# Patient Record
Sex: Female | Born: 1970 | Race: White | Hispanic: No | Marital: Married | Smoking: Never smoker
Health system: Southern US, Community
[De-identification: ages and names within clinical notes are randomized; demographics above are authoritative.]

## PROBLEM LIST (undated history)

## (undated) DIAGNOSIS — D649 Anemia, unspecified: Secondary | ICD-10-CM

## (undated) HISTORY — DX: Anemia, unspecified: D64.9

## (undated) HISTORY — PX: HERNIA REPAIR: SHX51

---

## 2001-04-19 ENCOUNTER — Encounter: Payer: Self-pay | Admitting: Family Medicine

## 2001-04-19 ENCOUNTER — Ambulatory Visit (HOSPITAL_COMMUNITY): Admission: RE | Admit: 2001-04-19 | Discharge: 2001-04-19 | Payer: Self-pay | Admitting: Family Medicine

## 2001-05-30 ENCOUNTER — Encounter: Payer: Self-pay | Admitting: Gastroenterology

## 2001-05-30 ENCOUNTER — Ambulatory Visit (HOSPITAL_COMMUNITY): Admission: RE | Admit: 2001-05-30 | Discharge: 2001-05-30 | Payer: Self-pay | Admitting: Gastroenterology

## 2001-07-10 ENCOUNTER — Encounter: Payer: Self-pay | Admitting: Gastroenterology

## 2001-07-10 ENCOUNTER — Encounter: Admission: RE | Admit: 2001-07-10 | Discharge: 2001-07-10 | Payer: Self-pay | Admitting: Gastroenterology

## 2002-01-19 ENCOUNTER — Ambulatory Visit (HOSPITAL_COMMUNITY): Admission: RE | Admit: 2002-01-19 | Discharge: 2002-01-19 | Payer: Self-pay | Admitting: Family Medicine

## 2002-01-19 ENCOUNTER — Encounter: Payer: Self-pay | Admitting: Family Medicine

## 2002-05-10 ENCOUNTER — Other Ambulatory Visit: Admission: RE | Admit: 2002-05-10 | Discharge: 2002-05-10 | Payer: Self-pay | Admitting: *Deleted

## 2003-05-01 IMAGING — US US ABDOMEN COMPLETE
1 series · 1 of 1 positions shown · non-contrast
Comparison: none

FINDINGS
INDICATION: LUQ PAIN.
COMPLETE ABDOMINAL ULTRASOUND:
NO PRIOR STUDIES.
THE AORTA DEMONSTRATES A NORMAL TAPERING COURSE.  PANCREATIC HEAD AND BODY APPEAR NORMAL, WITH THE
PANCREATIC TAIL OBSCURED BY OVERLYING BOWEL GAS.
LIVER APPEARS UNREMARKABLE, AS DOES THE GALLBLADDER.  COMMON BILE DUCT MEASURES 4 MM IN DIAMETER,
WHICH IS WITHIN NORMAL LIMITS.  SPLEEN APPEARS NORMAL.  RIGHT KIDNEY MEASURES 10.6 CM IN GREATEST
LENGTH AND THE LEFT KIDNEY MEASURES 11.0 CM IN GREATEST LENGTH.  BOTH KIDNEYS DEMONSTRATE NORMAL
ECHOGENICITY AND ECHOTEXTURE.  NO RENAL CALCULI, MASSES OR HYDRONEPHROSIS ARE DEMONSTRATED.
IMPRESSION
NORMAL COMPLETE ABDOMINAL ULTRASOUND.

[Series 1: us abdomen complete · 1 of 1 slices shown]
[im 1/1]
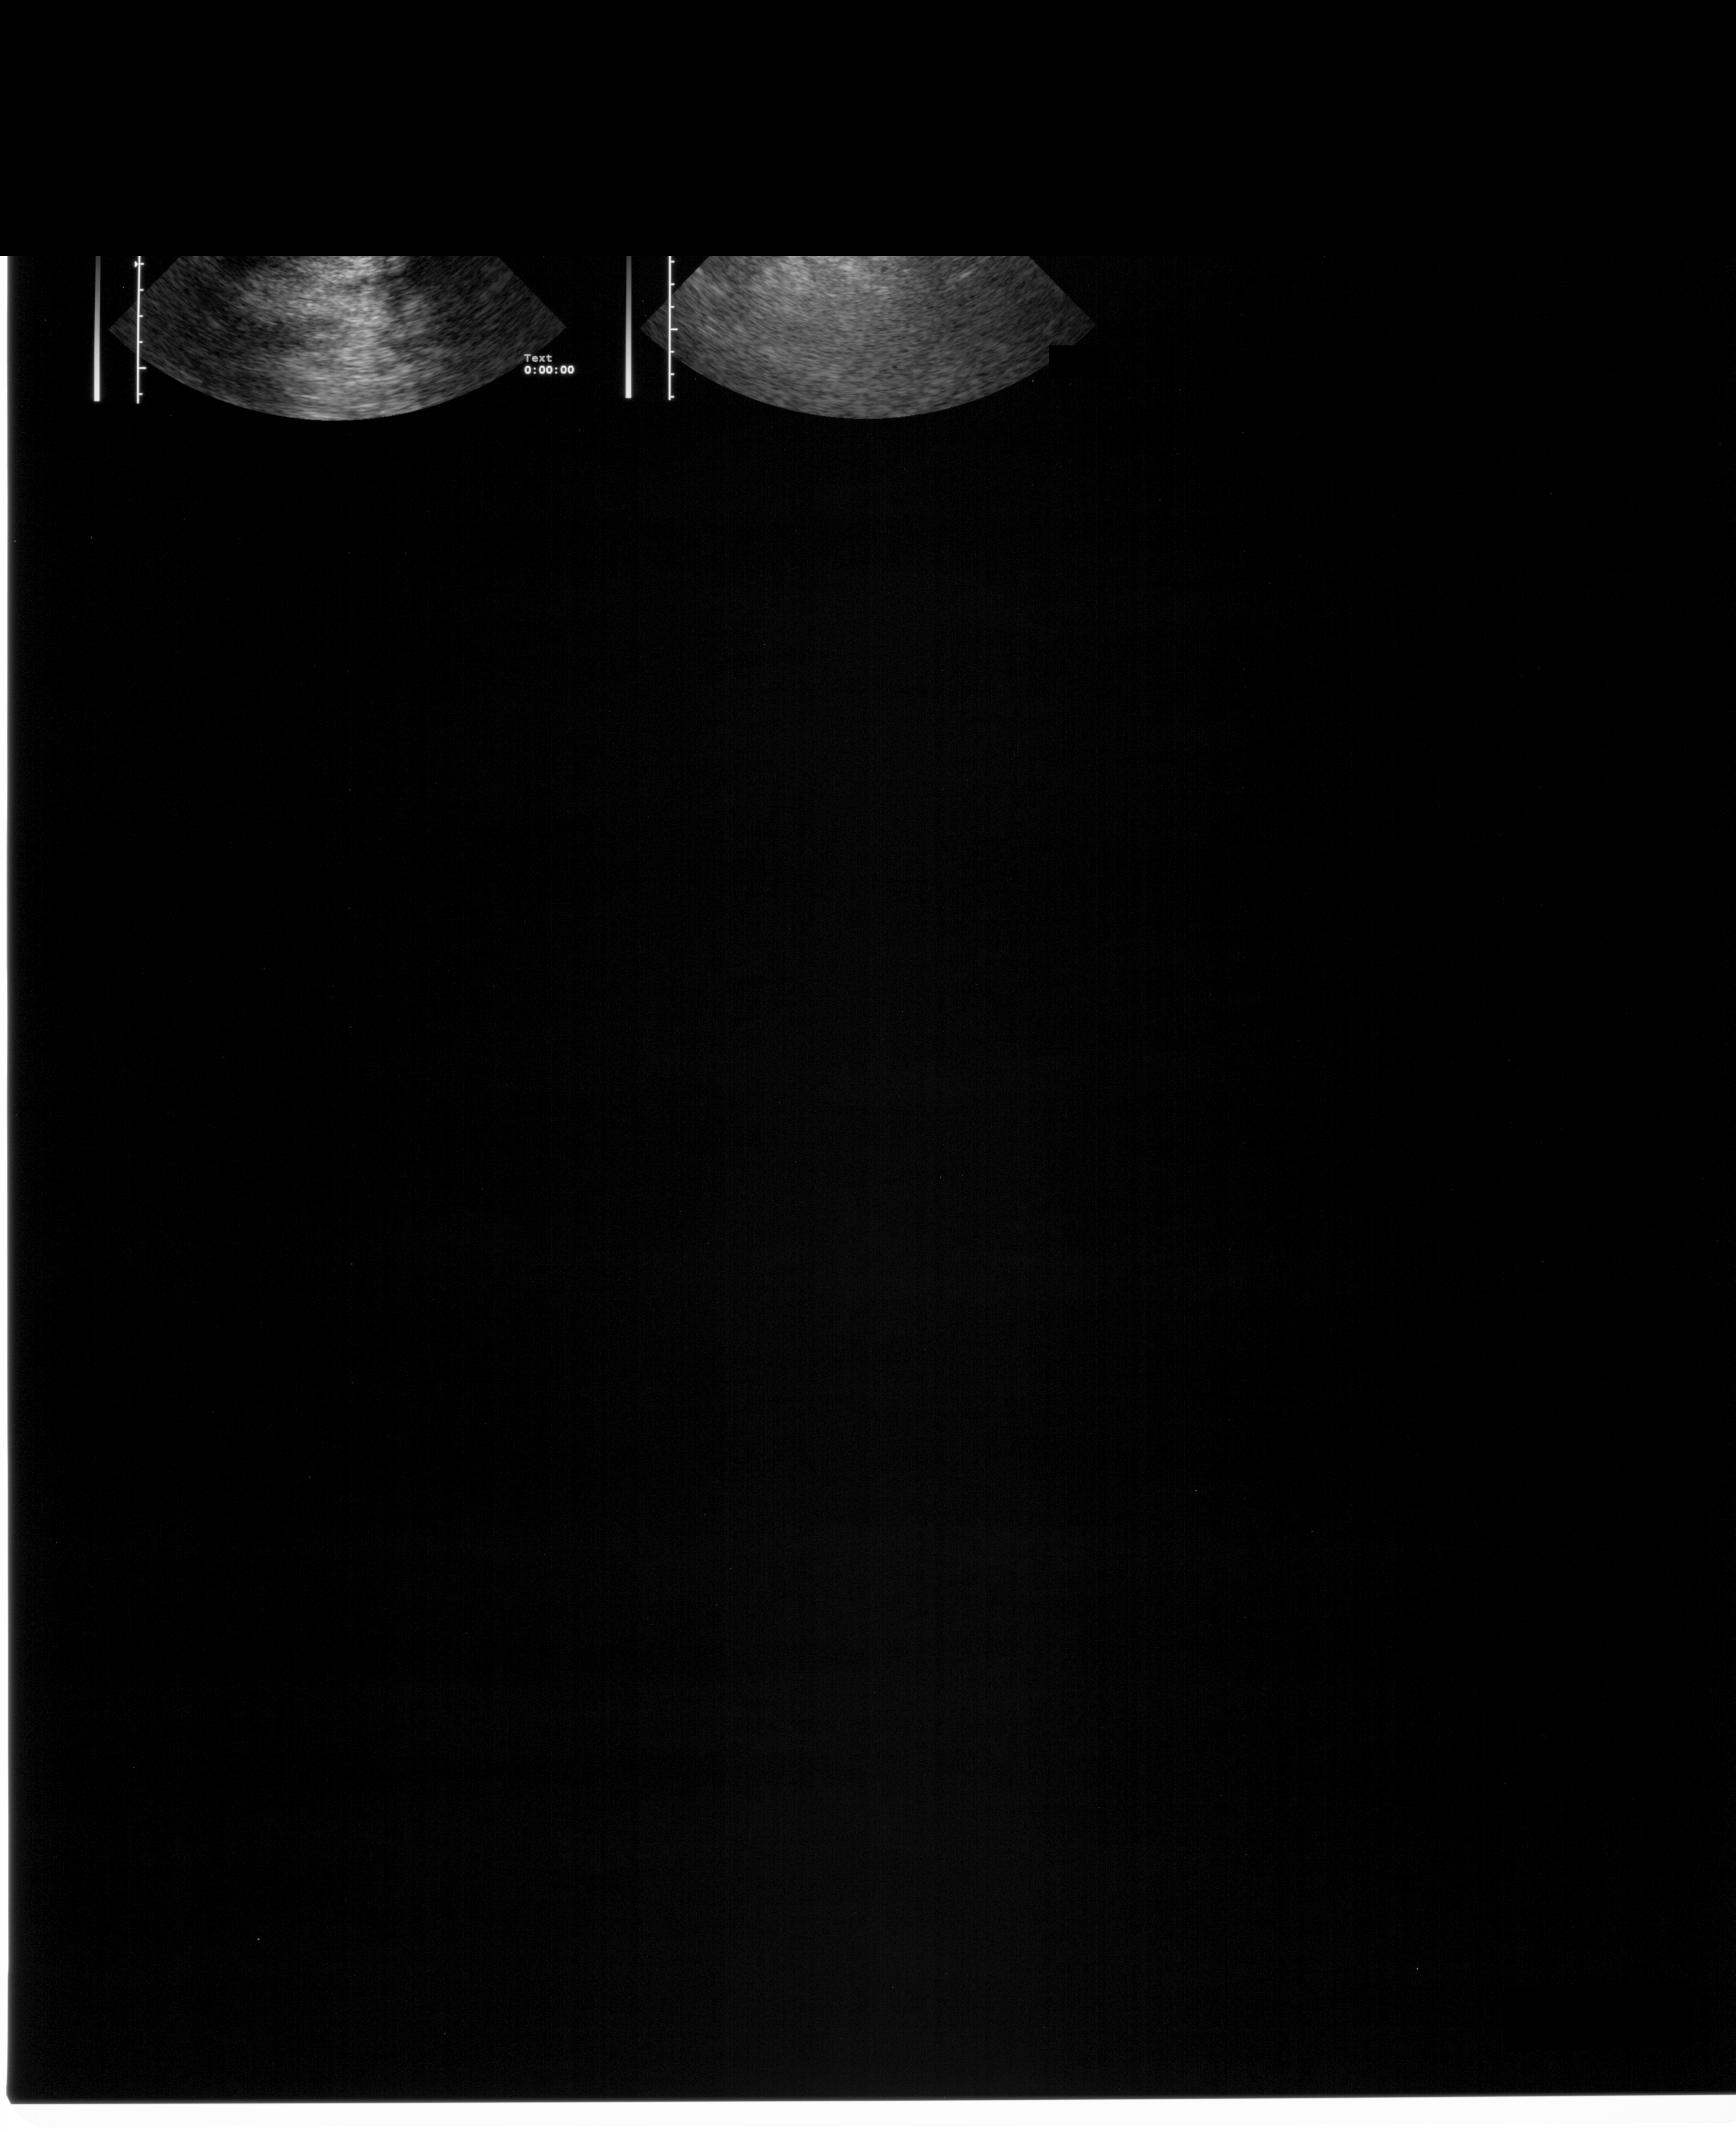

[1 of 1 positions shown; findings below may reference images not displayed]

## 2003-08-19 ENCOUNTER — Other Ambulatory Visit: Admission: RE | Admit: 2003-08-19 | Discharge: 2003-08-19 | Payer: Self-pay | Admitting: *Deleted

## 2005-01-22 ENCOUNTER — Encounter: Admission: RE | Admit: 2005-01-22 | Discharge: 2005-01-22 | Payer: Self-pay | Admitting: Family Medicine

## 2005-08-31 ENCOUNTER — Encounter: Admission: RE | Admit: 2005-08-31 | Discharge: 2005-08-31 | Payer: Self-pay | Admitting: Gastroenterology

## 2007-04-26 ENCOUNTER — Ambulatory Visit (HOSPITAL_COMMUNITY): Admission: RE | Admit: 2007-04-26 | Discharge: 2007-04-26 | Payer: Self-pay | Admitting: Family Medicine

## 2008-07-04 ENCOUNTER — Other Ambulatory Visit: Admission: RE | Admit: 2008-07-04 | Discharge: 2008-07-04 | Payer: Self-pay | Admitting: Family Medicine

## 2010-04-07 ENCOUNTER — Encounter: Admission: RE | Admit: 2010-04-07 | Discharge: 2010-04-07 | Payer: Self-pay | Admitting: Family Medicine

## 2011-04-27 ENCOUNTER — Other Ambulatory Visit (HOSPITAL_COMMUNITY)
Admission: RE | Admit: 2011-04-27 | Discharge: 2011-04-27 | Disposition: A | Discharge status: Home / Self Care | Payer: Self-pay | Source: Ambulatory Visit | Attending: Family Medicine | Admitting: Family Medicine

## 2011-04-27 DIAGNOSIS — Z01419 Encounter for gynecological examination (general) (routine) without abnormal findings: Secondary | ICD-10-CM | POA: Insufficient documentation

## 2011-07-28 ENCOUNTER — Other Ambulatory Visit: Payer: Self-pay | Admitting: Family Medicine

## 2011-07-28 DIAGNOSIS — Z1231 Encounter for screening mammogram for malignant neoplasm of breast: Secondary | ICD-10-CM

## 2011-08-04 ENCOUNTER — Ambulatory Visit: Payer: Self-pay

## 2012-06-05 ENCOUNTER — Ambulatory Visit
Admission: RE | Admit: 2012-06-05 | Discharge: 2012-06-05 | Disposition: A | Discharge status: Home / Self Care | Payer: BC Managed Care – PPO | Source: Ambulatory Visit | Attending: Family Medicine | Admitting: Family Medicine

## 2012-06-05 DIAGNOSIS — Z1231 Encounter for screening mammogram for malignant neoplasm of breast: Secondary | ICD-10-CM

## 2013-07-29 ENCOUNTER — Ambulatory Visit: Payer: BC Managed Care – PPO

## 2013-07-29 ENCOUNTER — Ambulatory Visit: Payer: BC Managed Care – PPO | Admitting: Family Medicine

## 2013-07-29 VITALS — BP 157/86 | HR 75 | Temp 99.0°F | Resp 16 | Ht 65.0 in | Wt 187.8 lb

## 2013-07-29 DIAGNOSIS — M25421 Effusion, right elbow: Secondary | ICD-10-CM

## 2013-07-29 DIAGNOSIS — M25521 Pain in right elbow: Secondary | ICD-10-CM

## 2013-07-29 DIAGNOSIS — IMO0002 Reserved for concepts with insufficient information to code with codable children: Secondary | ICD-10-CM

## 2013-07-29 DIAGNOSIS — L03119 Cellulitis of unspecified part of limb: Secondary | ICD-10-CM

## 2013-07-29 DIAGNOSIS — M25429 Effusion, unspecified elbow: Secondary | ICD-10-CM

## 2013-07-29 DIAGNOSIS — M25529 Pain in unspecified elbow: Secondary | ICD-10-CM

## 2013-07-29 LAB — POCT CBC
HCT, POC: 39.4 % (ref 37.7–47.9)
Lymph, poc: 2.2 (ref 0.6–3.4)
MPV: 11.6 fL (ref 0–99.8)
POC Granulocyte: 4.5 (ref 2–6.9)
POC LYMPH PERCENT: 30.2 %L (ref 10–50)
POC MID %: 6.8 %M (ref 0–12)

## 2013-07-29 MED ORDER — CEPHALEXIN 500 MG PO CAPS: 500.0000 mg | ORAL_CAPSULE | Freq: Three times a day (TID) | ORAL | Status: DC

## 2013-07-29 MED ORDER — DOXYCYCLINE HYCLATE 100 MG PO CAPS: 100.0000 mg | ORAL_CAPSULE | Freq: Two times a day (BID) | ORAL | Status: DC

## 2013-07-29 NOTE — Patient Instructions (Addendum)
1.  RETURN FOR FEVER > 100.5, INCREASING REDNESS/SWELLING/PAIN.  2.  APPLY HEAT TO AREA TWICE DAILY.   3.  REST R ARM.

## 2013-07-29 NOTE — Progress Notes (Signed)
[address]   [place], [place]  [address]   [phone]  Subjective:    Patient ID: Tiffany Wood, female    DOB: 08/08/1971, 42 y.o.   MRN: [id number]  HPI This 42 y.o. female presents for evaluation of R elbow pain.  Onset four days ago.  No fever/chills/sweats; mild malaise/fatigue today only.  Onset with golf ball sized swelling at end of elbow; the following day, the swelling improved. Also had diffuse swelling extending far beyond swelling.  Then redness improved.  Today swelling now extending into R forearm.  Has also had insect bites along elbow that pt has been scratching and manipulating.  Mild axillary LAD R.  No trauma to elbow. Elbow really tender when hits it on surface.  No similar symptoms in the past.   Review of Systems  Constitutional: Positive for fatigue. Negative for fever, chills and diaphoresis.  Musculoskeletal: Positive for myalgias, joint swelling and arthralgias.  Skin: Positive for color change. Negative for rash.  Neurological: Negative for weakness and numbness.    No past medical history on file.  No past surgical history on file.  Prior to Admission medications   Not on File    Allergies  Allergen Reactions  . Codeine Nausea Only    History   Social History  . Marital Status: Married    Spouse Name: N/A    Number of Children: N/A  . Years of Education: N/A   Occupational History  . Not on file.   Social History Main Topics  . Smoking status: Never Smoker   . Smokeless tobacco: Not on file  . Alcohol Use: Not on file  . Drug Use: Not on file  . Sexual Activity: Not on file   Other Topics Concern  . Not on file   Social History Narrative  . No narrative on file    No family history on file.     Objective:   Physical Exam  Nursing note and vitals reviewed. Constitutional: She is oriented to person, place, and time. She appears well-developed and well-nourished. No distress.  Musculoskeletal:       Right elbow: She  exhibits swelling. She exhibits normal range of motion, no effusion, no deformity and no laceration. Tenderness found. Olecranon process tenderness noted. No radial head, no medial epicondyle and no lateral epicondyle tenderness noted.  R ELBOW: SWELLING MEDIAL ASPECT OF ELBOW; +TTP OLECRANON BURSA WITH MILD SWELLING; FULL EXTENSION/FLEXION; NORMAL SUPINATION/PRONATION.  FULL ROM OF R WRIST. GRIP 5/5.  Neurological: She is alert and oriented to person, place, and time.  Skin: She is not diaphoretic.     SCANT/MILD ERYTHEMA POSTERIOR ELBOW 8 CM DIAMETER.  SKIN ABRASION SUPERFICIAL ALONG ELBOW; NO PUSTULES OR VESICLES.  Psychiatric: She has a normal mood and affect. Her behavior is normal.      [facility] reading (PRIMARY) by  Dr. Katrinka Blazing.  R ELBOW: NAD.  Results for orders placed in visit on 07/29/13  POCT CBC      Result Value Range   WBC 7.2  4.6 - 10.2 K/uL   Lymph, poc 2.2  0.6 - 3.4   POC LYMPH PERCENT 30.2  10 - 50 %L   MID (cbc) 0.5  0 - 0.9   POC MID % 6.8  0 - 12 %M   POC Granulocyte 4.5  2 - 6.9   Granulocyte percent 63.0  37 - 80 %G   RBC 4.92  4.04 - 5.48 M/uL   Hemoglobin 12.3  12.2 - 16.2 g/dL   HCT, POC 40.9  81.1 - 47.9 %   MCV 80.1  80 - 97 fL   MCH, POC 25.0 (*) 27 - 31.2 pg   MCHC 31.2 (*) 31.8 - 35.4 g/dL   RDW, POC 91.4     Platelet Count, POC 257  142 - 424 K/uL   MPV 11.6  0 - 99.8 fL     Assessment & Plan:  Pain, elbow, right  Swelling of elbow joint, right - Plan: POCT CBC, POCT SEDIMENTATION RATE, DG Elbow Complete Right  Cellulitis of elbow    1. Pain R elbow:  New. Secondary to cellulitis versus mild olecranon bursitis infected. 2.  Swelling of R elbow:  New. Secondary to cellulitis. 3.  Cellulitis of R elbow:  New. Rx for Keflex and Doxycycline.  RTC for fever, increasing redness/swelling/pain.  Meds ordered this encounter  Medications  . cephALEXin (KEFLEX) 500 MG capsule    Sig: Take 1 capsule (500 mg total) by mouth 3 (three) times daily.     Dispense:  30 capsule    Refill:  0  . doxycycline (VIBRAMYCIN) 100 MG capsule    Sig: Take 1 capsule (100 mg total) by mouth 2 (two) times daily.    Dispense:  20 capsule    Refill:  0

## 2013-08-22 ENCOUNTER — Ambulatory Visit: Payer: BC Managed Care – PPO

## 2013-08-22 ENCOUNTER — Ambulatory Visit: Payer: BC Managed Care – PPO | Admitting: Family Medicine

## 2013-08-22 VITALS — BP 150/90 | HR 72 | Temp 98.8°F | Resp 16 | Ht 66.0 in | Wt 187.0 lb

## 2013-08-22 DIAGNOSIS — S92919A Unspecified fracture of unspecified toe(s), initial encounter for closed fracture: Secondary | ICD-10-CM

## 2013-08-22 DIAGNOSIS — M25579 Pain in unspecified ankle and joints of unspecified foot: Secondary | ICD-10-CM

## 2013-08-22 DIAGNOSIS — S92911A Unspecified fracture of right toe(s), initial encounter for closed fracture: Secondary | ICD-10-CM

## 2013-08-22 DIAGNOSIS — B373 Candidiasis of vulva and vagina: Secondary | ICD-10-CM

## 2013-08-22 DIAGNOSIS — S99921A Unspecified injury of right foot, initial encounter: Secondary | ICD-10-CM

## 2013-08-22 DIAGNOSIS — N898 Other specified noninflammatory disorders of vagina: Secondary | ICD-10-CM

## 2013-08-22 DIAGNOSIS — M25571 Pain in right ankle and joints of right foot: Secondary | ICD-10-CM

## 2013-08-22 DIAGNOSIS — S8990XA Unspecified injury of unspecified lower leg, initial encounter: Secondary | ICD-10-CM

## 2013-08-22 DIAGNOSIS — B372 Candidiasis of skin and nail: Secondary | ICD-10-CM

## 2013-08-22 DIAGNOSIS — L293 Anogenital pruritus, unspecified: Secondary | ICD-10-CM

## 2013-08-22 LAB — POCT WET PREP WITH KOH
KOH Prep POC: POSITIVE
Trichomonas, UA: NEGATIVE

## 2013-08-22 MED ORDER — FLUCONAZOLE 150 MG PO TABS: 150.0000 mg | ORAL_TABLET | Freq: Once | ORAL | Status: DC

## 2013-08-22 MED ORDER — KETOCONAZOLE 2 % EX CREA: TOPICAL_CREAM | Freq: Every day | CUTANEOUS | Status: DC

## 2013-08-22 NOTE — Progress Notes (Signed)
Subjective:    Patient ID: Tiffany Wood, female    DOB: 1971-02-28, 42 y.o.   MRN: [id number]  HPI   Ms. Tiffany Wood is a very pleasant 42 yr old female here with two concerns today.    (1)  Was seen here approx 3 wks ago for cellulitis of her elbow, which has resolved after keflex and doxy.  Subsequently she has noted some vaginal and anal itching.  At one point the skin folds of her groin and the gluteal cleft were both very red and uncomfortable.  She tried Veterinary surgeon medicine with some relief, but symptoms have not fully resolved.  No history of yeast infection, but wonders if this could be the cause due to her recent antibiotic use.    (2)  Two days ago she dropped a granite slab onto the toes of her right foot (2nd, 3rd, 4th toes.)  Initially had pain and the toes bled as the edge of the granite had scraped her skin.  The right 4th toe subsequently became quite swollen and bruised.  It is tender to the touch, esp at the end of the toe.  Bruising now moving up into midfoot.  Swelling has somewhat resolved.  She is able to bear weight but admits to walking back on her heel due to toe pain.  She is using topical essential oils and Advil for pain with good relief.    Of note pt's BP elevated at triage - 150/90.  Pt reports that she gets very anxious at the doctor.  BP was up at last visit as well.  She subsequently checked home BPs daily after last visit with highest reading 126/76.  BP at home this AM was 136 systolic  Review of Systems  Constitutional: Negative.   HENT: Negative.   Respiratory: Negative.   Cardiovascular: Negative.   Gastrointestinal: Negative.   Genitourinary: Positive for vaginal discharge.  Musculoskeletal: Positive for joint swelling and arthralgias.  Skin: Positive for rash.  Neurological: Negative.        Objective:   Physical Exam  Vitals reviewed. Constitutional: She is oriented to person, place, and time. She appears well-developed and well-nourished. No  distress.  HENT:  Head: Normocephalic and atraumatic.  Eyes: No scleral icterus.  Pulmonary/Chest: Effort normal.  Genitourinary: There is rash and tenderness on the right labia. There is rash and tenderness on the left labia. Cervix exhibits no discharge and no friability. Vaginal discharge found.  Skin of labia and perineum erythematous and tender; no drainage, pustules, or vesicles; no satellite lesions   Musculoskeletal:       Right foot: She exhibits decreased range of motion, tenderness, bony tenderness and swelling. She exhibits normal capillary refill.       Feet:  Right 4th toe markedly swollen and bruised; very TTP; mild tenderness at distal metatarsal; tenderness with movement of the 4th toe; small abrasions to 2nd, 3rd, and 4th toes.  2nd and 3rd toes non-tender.  No tenderness in midfoot or ankle.  Pulses normal, cap refill normal  Neurological: She is alert and oriented to person, place, and time.  Skin: Skin is warm and dry.  Psychiatric: She has a normal mood and affect. Her behavior is normal.     [facility] reading (PRIMARY) by  Dr. Neva Seat - nondisplaced, nonarticular fx of 4th distal phalanx   Results for orders placed in visit on 08/22/13  POCT WET PREP WITH KOH      Result Value Range   Trichomonas, UA Negative  Clue Cells Wet Prep HPF POC 0-2     Epithelial Wet Prep HPF POC 1-4     Yeast Wet Prep HPF POC neg     Bacteria Wet Prep HPF POC 2+     RBC Wet Prep HPF POC 0-2     WBC Wet Prep HPF POC 2-6     KOH Prep POC Positive         Assessment & Plan:  Toe fracture, right, closed, initial encounter  -- 42 yr old female here with non-displaced fx of the 4th right toe.  Buddy tape x 4-6wks.  Discussed footwear options - pt has sturdy hiking shoes that she can wear and would prefer this to post-op shoe.  Encouraged pt to wear supportive footwear for at least 4 wks.  Ice and elevated the foot for over the next 48 hours.  Continue Advil for pain relief.  Advance  activity as tolerated.  RTC if needed - follow up will be covered under fracture code.  Foot injury, right, initial encounter - Plan: DG Foot Complete Right  -- See above  Pain in joint, ankle and foot, right - Plan: DG Foot Complete Right  -- See above.  Vaginal candidiasis - Plan: fluconazole (DIFLUCAN) 150 MG tablet  -- Pt with 2 wks of vaginal itching s/p antibiotic use.  Wet prep +KOH with budding yeast.  Will treat with fluconazole x 1.  Repeat in 1 wk if necessary.  Vaginal itching - Plan: POCT Wet Prep with KOH  -- See above.  Candidal intertrigo - Plan: ketoconazole (NIZORAL) 2 % cream  -- In addition to vaginal itching, pt with erythematous itchy skin at groin and gluteal cleft.  On exam, the area is erythematous and tender though not beefy red and with no satellite lesions.  Given recent antibiotic use and +vaginal candida, suspect candidal intertrigo.  Fluconazole x 1 today.  Will also use topical ketoconazole to affected areas.  RTC if worsening or not improving.

## 2013-08-22 NOTE — Patient Instructions (Addendum)
Take the fluconazole pill once today (repeat in one week if needed).  Apply the ketoconazole cream to affected areas once daily until symptoms resolve.    Keep the toes buddy taped for the next 4-6 weeks.  Wear a sturdy, supportive shoe for the next 4 weeks.  Ice and elevate for the next couple days when possible.  Advil and/or Tylenol as needed.  Advance activity as tolerated.  Come back in if worsening or not improving   Toe Fracture Your caregiver has diagnosed you as having a fractured toe. A toe fracture is a break in the bone of a toe. "Buddy taping" is a way of splinting your broken toe, by taping the broken toe to the toe next to it. This "buddy taping" will keep the injured toe from moving beyond normal range of motion. Buddy taping also helps the toe heal in a more normal alignment. It may take 6 to 8 weeks for the toe injury to heal. HOME CARE INSTRUCTIONS   Leave your toes taped together for as long as directed by your caregiver or until you see a doctor for a follow-up examination. You can change the tape after bathing. Always use a small piece of gauze or cotton between the toes when taping them together. This will help the skin stay dry and prevent infection.  Apply ice to the injury for 15-20 minutes each hour while awake for the first 2 days. Put the ice in a plastic bag and place a towel between the bag of ice and your skin.  After the first 2 days, apply heat to the injured area. Use heat for the next 2 to 3 days. Place a heating pad on the foot or soak the foot in warm water as directed by your caregiver.  Keep your foot elevated as much as possible to lessen swelling.  Wear sturdy, supportive shoes. The shoes should not pinch the toes or fit tightly against the toes.  Your caregiver may prescribe a rigid shoe if your foot is very swollen.  Your may be given crutches if the pain is too great and it hurts too much to walk.  Only take over-the-counter or prescription  medicines for pain, discomfort, or fever as directed by your caregiver.  If your caregiver has given you a follow-up appointment, it is very important to keep that appointment. Not keeping the appointment could result in a chronic or permanent injury, pain, and disability. If there is any problem keeping the appointment, you must call back to this facility for assistance. SEEK MEDICAL CARE IF:   You have increased pain or swelling, not relieved with medications.  The pain does not get better after 1 week.  Your injured toe is cold when the others are warm. SEEK IMMEDIATE MEDICAL CARE IF:   The toe becomes cold, numb, or white.  The toe becomes hot (inflamed) and red. Document Released: 11/19/2000 Document Revised: 02/14/2012 Document Reviewed: 07/08/2008 [facility] Patient Information 2014 [place], [place].

## 2013-08-22 NOTE — Progress Notes (Signed)
Xray read and patient discussed with Ms. Debbra Riding. Agree with assessment and plan of care per her note. XR report noted:  FINDINGS:  The patient has a nondisplaced, mildly comminuted fracture of the  distal phalanx of the right 4th toe with associated soft tissue  swelling. No other acute bony or joint abnormality is identified.  Small plantar calcaneal spur is noted.  IMPRESSION:  Nondisplaced fracture distal phalanx right 4th toe.

## 2013-10-11 ENCOUNTER — Other Ambulatory Visit: Payer: Self-pay

## 2014-04-15 ENCOUNTER — Other Ambulatory Visit: Payer: Self-pay | Admitting: Family Medicine

## 2014-04-15 ENCOUNTER — Ambulatory Visit
Admission: RE | Admit: 2014-04-15 | Discharge: 2014-04-15 | Disposition: A | Discharge status: Home / Self Care | Payer: BC Managed Care – PPO | Source: Ambulatory Visit | Attending: Family Medicine | Admitting: Family Medicine

## 2014-04-15 DIAGNOSIS — S90129A Contusion of unspecified lesser toe(s) without damage to nail, initial encounter: Secondary | ICD-10-CM

## 2014-09-20 ENCOUNTER — Other Ambulatory Visit: Payer: Self-pay

## 2014-11-05 HISTORY — PX: OTHER SURGICAL HISTORY: SHX169

## 2015-05-20 ENCOUNTER — Ambulatory Visit (INDEPENDENT_AMBULATORY_CARE_PROVIDER_SITE_OTHER): Payer: BLUE CROSS/BLUE SHIELD | Admitting: Physician Assistant

## 2015-05-20 VITALS — BP 138/82 | HR 93 | Temp 98.6°F | Resp 16 | Ht 65.0 in | Wt 177.0 lb

## 2015-05-20 DIAGNOSIS — R1013 Epigastric pain: Secondary | ICD-10-CM | POA: Diagnosis not present

## 2015-05-20 DIAGNOSIS — R110 Nausea: Secondary | ICD-10-CM

## 2015-05-20 DIAGNOSIS — M545 Low back pain: Secondary | ICD-10-CM

## 2015-05-20 LAB — POCT URINE PREGNANCY: Preg Test, Ur: NEGATIVE

## 2015-05-20 LAB — POCT CBC
Granulocyte percent: 79.9 %G (ref 37–80)
HEMATOCRIT: 41.7 % (ref 37.7–47.9)
HEMOGLOBIN: 12.9 g/dL (ref 12.2–16.2)
LYMPH, POC: 1.3 (ref 0.6–3.4)
MCH: 24.5 pg — AB (ref 27–31.2)
MCHC: 31 g/dL — AB (ref 31.8–35.4)
MCV: 79.3 fL — AB (ref 80–97)
MID (cbc): 0.1 (ref 0–0.9)
MPV: 8.8 fL (ref 0–99.8)
POC Granulocyte: 5.9 (ref 2–6.9)
POC LYMPH %: 18.1 % (ref 10–50)
POC MID %: 2 %M (ref 0–12)
Platelet Count, POC: 256 10*3/uL (ref 142–424)
RBC: 5.25 M/uL (ref 4.04–5.48)
RDW, POC: 16.2 %
WBC: 7.4 10*3/uL (ref 4.6–10.2)

## 2015-05-20 LAB — POCT URINALYSIS DIPSTICK
BILIRUBIN UA: NEGATIVE
GLUCOSE UA: NEGATIVE
Ketones, UA: NEGATIVE
Leukocytes, UA: NEGATIVE
Nitrite, UA: NEGATIVE
Protein, UA: NEGATIVE
RBC UA: NEGATIVE
SPEC GRAV UA: 1.025
UROBILINOGEN UA: 0.2
pH, UA: 5

## 2015-05-20 LAB — POCT UA - MICROSCOPIC ONLY
Bacteria, U Microscopic: NEGATIVE
Casts, Ur, LPF, POC: NEGATIVE
Crystals, Ur, HPF, POC: NEGATIVE
Mucus, UA: NEGATIVE
RBC, URINE, MICROSCOPIC: NEGATIVE
Yeast, UA: NEGATIVE

## 2015-05-20 LAB — POC HEMOCCULT BLD/STL (OFFICE/1-CARD/DIAGNOSTIC): FECAL OCCULT BLD: NEGATIVE

## 2015-05-20 LAB — GLUCOSE, POCT (MANUAL RESULT ENTRY): POC GLUCOSE: 103 mg/dL — AB (ref 70–99)

## 2015-05-20 NOTE — Progress Notes (Signed)
[facility] [address], [place] [address] [phone]  Date:  05/20/2015   Name:  Tiffany Wood   DOB:  Feb 04, 1971   MRN:  [id number]  PCP:  Lolita Patella, MD    History of Present Illness:  Tiffany Wood is a 44 y.o. female patient who present to [facility] with epigastric pain. 1 month of a knot-like pain at epigastric region.  More pronounced at night.  She felt nausea and dizziness within the last few nights.  Diarrhea this morning, and abdominal cramping today.  No blood in the stool or melena.  She does take iron, but it is darkened.  She has no dizziness.  She has some fever and chills.  She has no known weight loss.  Last meal was at 6:30pm yesterday.  She has no heartburn, though she has a hx 10 years ago of reflux.  This has resolved since.   There is frequency , however she believes this is secondary to her increase in hydration.  She denies dysuria or hematuria. There is no fever.  There is no sob, palpitations, or radiating pain.  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 She is into more holistic care and states that she would prefer to manage her health through lifestyle choices rather than medications at this time.  She has a hx of GERD, but states that she treated this with ginger.     Last menstrual period was April 05, 2015. Her last pregnancy was 21 years ago.  She had an ablation last year.  Periods are generally irregular and with cramping.  She believes that her period is arriving shortly--she currently has premenstrual symptoms of low cramping.   Low back pain--noticed at night, when she reclines, it will hurt.  There is no weakness, tingling or numbness at this time.      There are no active problems to display for this patient.   Past Medical History  Diagnosis Date  . Anemia     Past Surgical History  Procedure Laterality Date  . Hernia repair    . Cesarean section    . Ablasion   11/2014     History  Substance Use Topics  . Smoking status: Never Smoker   . Smokeless tobacco: Not on file  . Alcohol Use: Yes    Family History  Problem Relation Age of Onset  . Thyroid disease Mother   . Hypertension Mother   . Hypertension Father   . Arthritis Father     Allergies  Allergen Reactions  . Codeine Nausea Only    Medication list has been reviewed and updated.  Current Outpatient Prescriptions on File Prior to Visit  Medication Sig Dispense Refill  . fluconazole (DIFLUCAN) 150 MG tablet Take 1 tablet (150 mg total) by mouth once. Repeat in 1 wk if needed (Patient not taking: Reported on 05/20/2015) 2 tablet 0  . ketoconazole (NIZORAL) 2 % cream Apply topically daily. (Patient not taking: Reported on 05/20/2015) 60 g 0   No current facility-administered medications on file prior to visit.    ROS ROS otherwise unremarkable unless listed above.  Physical Examination: BP 138/82 mmHg  Pulse 93  Temp(Src) 98.6 F (37 C) (Oral)  Resp 16  Ht 5\' 5"  (1.651 m)  Wt 177 lb (80.287 kg)  BMI 29.45 kg/m2  SpO2 98%  LMP 04/05/2015 Ideal Body Weight: Weight in (lb) to have BMI = 25: 149.9 Wt  Readings from Last 3 Encounters:  05/20/15 177 lb (80.287 kg)  08/22/13 187 lb (84.823 kg)  07/29/13 187 lb 12.8 oz (85.186 kg)    Physical Exam  alert cooperative and oriented 4. Conjunctiva is normal. There is no thyromegaly or mass to neck. No lymph node enlargement. Lung sounds are normal without wheezing or rhonchi. Heart with regular rate and rhythm without murmurs, rubs, or gallops. There is an abdominal mass upon palpation of her epigastric there is mild tenderness here. Negative Murphy's. Just inferior to umbilicus, some tenderness upon palpation however no masses are appreciated. No abdominal bruit. Normal DP and radial pulses. Normal for flexion range of motion there is some tenderness of the lower sacrum when extending back to standing position. Normal lateral deviation  without tenderness normal torso horizontal rotation as well. There is some tenderness along the piriformis is muscle.  No tenderness at the sciatic notch.  There is no spasm appreciated.  Results for orders placed or performed in visit on 05/20/15  COMPLETE METABOLIC PANEL WITH GFR  Result Value Ref Range   Sodium 137 135 - 145 mEq/L   Potassium 5.1 3.5 - 5.3 mEq/L   Chloride 106 96 - 112 mEq/L   CO2 23 19 - 32 mEq/L   Glucose, Bld 98 70 - 99 mg/dL   BUN 13 6 - 23 mg/dL   Creat 1.61 0.96 - 0.45 mg/dL   Total Bilirubin 0.3 0.2 - 1.2 mg/dL   Alkaline Phosphatase 65 39 - 117 U/L   AST 22 0 - 37 U/L   ALT 15 0 - 35 U/L   Total Protein 7.2 6.0 - 8.3 g/dL   Albumin 4.3 3.5 - 5.2 g/dL   Calcium 9.5 8.4 - 40.9 mg/dL   GFR, Est African American >89 mL/min   GFR, Est Non African American >89 mL/min  TSH  Result Value Ref Range   TSH 1.081 0.350 - 4.500 uIU/mL  Lipase  Result Value Ref Range   Lipase 31 0 - 75 U/L  POCT CBC  Result Value Ref Range   WBC 7.4 4.6 - 10.2 K/uL   Lymph, poc 1.3 0.6 - 3.4   POC LYMPH PERCENT 18.1 10 - 50 %L   MID (cbc) 0.1 0 - 0.9   POC MID % 2.0 0 - 12 %M   POC Granulocyte 5.9 2 - 6.9   Granulocyte percent 79.9 37 - 80 %G   RBC 5.25 4.04 - 5.48 M/uL   Hemoglobin 12.9 12.2 - 16.2 g/dL   HCT, POC 81.1 91.4 - 47.9 %   MCV 79.3 (A) 80 - 97 fL   MCH, POC 24.5 (A) 27 - 31.2 pg   MCHC 31.0 (A) 31.8 - 35.4 g/dL   RDW, POC 78.2 %   Platelet Count, POC 256 142 - 424 K/uL   MPV 8.8 0 - 99.8 fL  POCT glucose (manual entry)  Result Value Ref Range   POC Glucose 103 (A) 70 - 99 mg/dl  POCT urinalysis dipstick  Result Value Ref Range   Color, UA yellow    Clarity, UA clear    Glucose, UA neg    Bilirubin, UA neg    Ketones, UA neg    Spec Grav, UA 1.025    Blood, UA neg    pH, UA 5.0    Protein, UA neg    Urobilinogen, UA 0.2    Nitrite, UA neg    Leukocytes, UA Negative Negative  POCT urine pregnancy  Result Value Ref Range   Preg Test, Ur Negative  Negative  POCT UA - Microscopic Only  Result Value Ref Range   WBC, Ur, HPF, POC 0-1    RBC, urine, microscopic neg    Bacteria, U Microscopic neg    Mucus, UA neg    Epithelial cells, urine per micros 0-1    Crystals, Ur, HPF, POC neg    Casts, Ur, LPF, POC neg    Yeast, UA neg   POC Hemoccult Bld/Stl (1-Cd Office Dx)  Result Value Ref Range   Card #1 Date     Fecal Occult Blood, POC Negative Negative    Assessment and Plan: 44 year old female with PMH listed above is here today with epigastric pain.  Diff dx includes peptic ulcer, pancreatitis, gallbladder disease/obstruction, reflux, or gastritis.  I have recommended that we [place].  Advised to start a PPI or H2 seperately, but she declines this, and would like a more holistic approach.  I have warned of the dangers of acid reflux untreated and potential harms.  Patient states that she will restart a ginger regimen.  I will contact with results.  Advised of anemia deficiency, and advised to increase iron food intake.  She was given back stretches to perform for the low back and buttocks, to decrease piriformis tightness.     Abdominal pain, epigastric - Plan: POCT CBC, COMPLETE METABOLIC PANEL WITH GFR, Lipase, US Abdomen Complete, POC Hemoccult Bld/Stl (1-Cd Office Dx)  Low back pain without sciatica, unspecified back pain laterality - Plan: POCT urinalysis dipstick, POCT urine pregnancy, POCT UA - Microscopic Only  Nausea without vomiting - Plan: COMPLETE METABOLIC PANEL WITH GFR, POCT glucose (manual entry), TSH, POCT urinalysis dipstick, POCT urine pregnancy, POCT UA - Microscopic Only, POC Hemoccult Bld/Stl (1-Cd Office Dx)    Trena Platt, PA-C [facility] 05/20/2015 8:37 AM    \

## 2015-05-20 NOTE — Patient Instructions (Addendum)
Piriformis Syndrome with Rehab Piriformis syndrome is a condition the affects the nervous system in the area of the hip, and is characterized by pain and possibly a loss of feeling in the backside (posterior) thigh that may extend down the entire length of the leg. The symptoms are caused by an increase in pressure on the sciatic nerve by the piriformis muscle, which is on the back of the hip and is responsible for externally rotating the hip. The sciatic nerve and its branches connect to much of the leg. Normally the sciatic nerve runs between the piriformis muscle and other muscles. However, in certain individuals the nerve runs through the muscle, which causes an increase in pressure on the nerve and results in the symptoms of piriformis syndrome. SYMPTOMS   Pain, tingling, numbness, or burning in the back of the thigh that may also extend down the entire leg.  Occasionally, tenderness in the buttock.  Loss of function of the leg.  Pain that worsens when using the piriformis muscle (running, jumping, or stairs).  Pain that increases with prolonged sitting.  Pain that is lessened by lying flat on the back. CAUSES   Piriformis syndrome is the result of an increase in pressure placed on the sciatic nerve. Oftentimes, piriformis syndrome is an overuse injury.  Stress placed on the nerve from a sudden increase in the intensity, frequency, or duration of training.  Compensation of other extremity injuries. RISK INCREASES WITH:  Sports that involve the piriformis muscle (running, walking, or jumping).  You are born with (congenital) a defect in which the sciatic nerve passes through the muscle. PREVENTION  Warm up and stretch properly before activity.  Allow for adequate recovery between workouts.  Maintain physical fitness:  Strength, flexibility, and endurance.  Cardiovascular fitness. PROGNOSIS  If treated properly, the symptoms of piriformis syndrome usually resolve in 2 to 6  weeks. RELATED COMPLICATIONS   Persistent and possibly permanent pain and numbness in the lower extremity.  Weakness of the extremity that may progress to disability and inability to compete. TREATMENT  The most effective treatment for piriformis syndrome is rest from any activities that aggravate the symptoms. Ice and pain medication may help reduce pain and inflammation. The use of strengthening and stretching exercises may help reduce pain with activity. These exercises may be performed at home or with a therapist. A referral to a therapist may be given for further evaluation and treatment, such as ultrasound. Corticosteroid injections may be given to reduce inflammation that is causing pressure to be placed on the sciatic nerve. If nonsurgical (conservative) treatment is unsuccessful, then surgery may be recommended.  MEDICATION   If pain medication is necessary, then nonsteroidal anti-inflammatory medications, such as aspirin and ibuprofen, or other minor pain relievers, such as acetaminophen, are often recommended.  Do not take pain medication for 7 days before surgery.  Prescription pain relievers may be given if deemed necessary by your caregiver. Use only as directed and only as much as you need.  Corticosteroid injections may be given by your caregiver. These injections should be reserved for the most serious cases, because they may only be given a certain number of times. HEAT AND COLD:   Cold treatment (icing) relieves pain and reduces inflammation. Cold treatment should be applied for 10 to 15 minutes every 2 to 3 hours for inflammation and pain and immediately after any activity that aggravates your symptoms. Use ice packs or massage the area with a piece of ice (ice massage).  Heat   treatment may be used prior to performing the stretching and strengthening activities prescribed by your caregiver, physical therapist, or athletic trainer. Use a heat pack or soak the injury in warm  water. SEEK IMMEDIATE MEDICAL CARE IF:  Treatment seems to offer no benefit, or the condition worsens.  Any medications produce adverse side effects. EXERCISES RANGE OF MOTION (ROM) AND STRETCHING EXERCISES - Piriformis Syndrome These exercises may help you when beginning to rehabilitate your injury. Your symptoms may resolve with or without further involvement from your physician, physical therapist, or athletic trainer. While completing these exercises, remember:   Restoring tissue flexibility helps normal motion to return to the joints. This allows healthier, less painful movement and activity.  An effective stretch should be held for at least 30 seconds.  A stretch should never be painful. You should only feel a gentle lengthening or release in the stretched tissue. STRETCH - Hip Rotators  Lie on your back on a firm surface. Grasp your right / left knee with your right / left hand and your ankle with your opposite hand.  Keeping your hips and shoulders firmly planted, gently pull your right / left knee and rotate your lower leg toward your opposite shoulder until you feel a stretch in your buttocks.  Hold this stretch for __________ seconds. Repeat this stretch __________ times. Complete this stretch __________ times per day. STRETCH - Iliotibial Band  On the floor or bed, lie on your side so your right / left leg is on top. Bend your knee and grab your ankle.  Slowly bring your knee back so that your thigh is in line with your trunk. Keep your heel at your buttocks and gently arch your back so your head, shoulders, and hips line up.  Slowly lower your leg so that your knee approaches the floor/bed until you feel a gentle stretch on the outside of your right / left thigh. If you do not feel a stretch and your knee will not fall farther, place the heel of your opposite foot on top of your knee and pull your thigh down farther.  Hold this stretch for __________ seconds. Repeat  __________ times. Complete __________ times per day. STRENGTHENING EXERCISES - Piriformis Syndrome  These are some of the caregiver again or until your symptoms are resolved. Remember:   Strong muscles with good endurance tolerate stress better.  Do the exercises as initially prescribed by your caregiver. Progress slowly with each exercise, gradually increasing the number of repetitions and weight used under their guidance. STRENGTH - Hip Abductors, Straight Leg Raises Be aware of your form throughout the entire exercise so that you exercise the correct muscles. Sloppy form means that you are not strengthening the correct muscles.  Lie on your side so that your head, shoulders, knee, and hip line up. You may bend your lower knee to help maintain your balance. Your right / left leg should be on top.  Roll your hips slightly forward, so that your hips are stacked directly over each other and your right / left knee is facing forward.  Lift your top leg up 4-6 inches, leading with your heel. Be sure that your foot does not drift forward or that your knee does not roll toward the ceiling.  Hold this position for __________ seconds. You should feel the muscles in your outer hip lifting (you may not notice this until your leg begins to tire).  Slowly lower your leg to the starting position. Allow the muscles to fully  relax before beginning the next repetition. Repeat __________ times. Complete this exercise __________ times per day.  STRENGTH - Hip Abductors, Quadruped  On a firm, lightly padded surface, position yourself on your hands and knees. Your hands should be directly below your shoulders and your knees should be directly below your hips.  Keeping your right / left knee bent, lift your leg out to the side. Keep your legs level and in line with your shoulders.  Position yourself on your hands and knees.  Hold for __________ seconds.  Keeping your trunk steady and your hips level, slowly  lower your leg to the starting position. Repeat __________ times. Complete this exercise __________ times per day.  STRENGTH - Hip Abductors, Standing  Tie one end of a rubber exercise band/tubing to a secure surface (table, pole) and tie a loop at the other end.  Place the loop around your right / left ankle. Keeping your ankle with the band directly opposite of the secured end, step away until there is tension in the tube/band.  Hold onto a chair as needed for balance.  Keeping your back upright, your shoulders over your hips, and your toes pointing forward, lift your right / left leg out to your side. Be sure to lift your leg with your hip muscles. Do not "throw" your leg or tip your body to lift your leg.  Slowly and with control, return to the starting position. Repeat exercise __________ times. Complete this exercise __________ times per day.  Document Released: 11/22/2005 Document Revised: 04/08/2014 Document Reviewed: 03/06/2009 [facility] Patient Information 2015 [place], [place]. This information is not intended to replace advice given to you by your health care provider. Make sure you discuss any questions you have with your health care provider. Food Choices for Gastroesophageal Reflux Disease When you have gastroesophageal reflux disease (GERD), the foods you eat and your eating habits are very important. Choosing the right foods can help ease the discomfort of GERD. WHAT GENERAL GUIDELINES DO I NEED TO FOLLOW?  Choose fruits, vegetables, whole grains, low-fat dairy products, and low-fat meat, fish, and poultry.  Limit fats such as oils, salad dressings, butter, nuts, and avocado.  Keep a food diary to identify foods that cause symptoms.  Avoid foods that cause reflux. These may be different for different people.  Eat frequent small meals instead of three large meals each day.  Eat your meals slowly, in a relaxed setting.  Limit fried foods.  Cook foods using methods other  than frying.  Avoid drinking alcohol.  Avoid drinking large amounts of liquids with your meals.  Avoid bending over or lying down until 2-3 hours after eating. WHAT FOODS ARE NOT RECOMMENDED? The following are some foods and drinks that may worsen your symptoms: Vegetables Tomatoes. Tomato juice. Tomato and spaghetti sauce. Chili peppers. Onion and garlic. Horseradish. Fruits Oranges, grapefruit, and lemon (fruit and juice). Meats High-fat meats, fish, and poultry. This includes hot dogs, ribs, ham, sausage, salami, and bacon. Dairy Whole milk and chocolate milk. Sour cream. Cream. Butter. Ice cream. Cream cheese.  Beverages Coffee and tea, with or without caffeine. Carbonated beverages or energy drinks. Condiments Hot sauce. Barbecue sauce.  Sweets/Desserts Chocolate and cocoa. Donuts. Peppermint and spearmint. Fats and Oils High-fat foods, including [place] fries and potato chips. Other Vinegar. Strong spices, such as black pepper, white pepper, red pepper, cayenne, curry powder, cloves, ginger, and chili powder. The items listed above may not be a complete list of foods and beverages to avoid.  Contact your dietitian for more information. Document Released: 11/22/2005 Document Revised: 11/27/2013 Document Reviewed: 09/26/2013 [facility] Patient Information 2015 [place], [place]. This information is not intended to replace advice given to you by your health care provider. Make sure you discuss any questions you have with your health care provider.

## 2015-05-21 ENCOUNTER — Telehealth: Payer: Self-pay | Admitting: Physician Assistant

## 2015-05-21 LAB — LIPASE: Lipase: 31 U/L (ref 0–75)

## 2015-05-21 LAB — COMPLETE METABOLIC PANEL WITH GFR
ALBUMIN: 4.3 g/dL (ref 3.5–5.2)
ALT: 15 U/L (ref 0–35)
AST: 22 U/L (ref 0–37)
Alkaline Phosphatase: 65 U/L (ref 39–117)
BUN: 13 mg/dL (ref 6–23)
CALCIUM: 9.5 mg/dL (ref 8.4–10.5)
CHLORIDE: 106 meq/L (ref 96–112)
CO2: 23 mEq/L (ref 19–32)
Creat: 0.63 mg/dL (ref 0.50–1.10)
Glucose, Bld: 98 mg/dL (ref 70–99)
POTASSIUM: 5.1 meq/L (ref 3.5–5.3)
SODIUM: 137 meq/L (ref 135–145)
TOTAL PROTEIN: 7.2 g/dL (ref 6.0–8.3)
Total Bilirubin: 0.3 mg/dL (ref 0.2–1.2)

## 2015-05-21 LAB — TSH: TSH: 1.081 u[IU]/mL (ref 0.350–4.500)

## 2015-05-21 NOTE — Telephone Encounter (Signed)
Patient states that she was in terrible pain last night. The left side of her abdomen was hurting very bad.   [phone]

## 2015-05-21 NOTE — Telephone Encounter (Signed)
Spoke with pt, she states she is in a lot of pain. Can we check on this ultrasound and see if we could get this done today?

## 2015-05-28 ENCOUNTER — Ambulatory Visit
Admission: RE | Admit: 2015-05-28 | Discharge: 2015-05-28 | Disposition: A | Discharge status: Home / Self Care | Payer: Self-pay | Source: Ambulatory Visit | Attending: Physician Assistant | Admitting: Physician Assistant

## 2015-05-28 DIAGNOSIS — R1013 Epigastric pain: Secondary | ICD-10-CM

## 2015-05-30 ENCOUNTER — Telehealth: Payer: Self-pay

## 2015-05-30 NOTE — Telephone Encounter (Signed)
Patient is calling regarding her recent US results. I spoke with Judeth Cornfield about this and she is aware

## 2015-06-01 ENCOUNTER — Telehealth: Payer: Self-pay | Admitting: Physician Assistant

## 2015-06-01 NOTE — Telephone Encounter (Signed)
Left message of ultrasound: Normal but some findings consistent with a fatty liver.  Left message to follow anti-reflux plan, as well as healthy weight loss.  BMI classified as overweight, and decreasing weight to under 155 would with exercise and appropriate diet would work well for her.  If her sxs do not improve within one week, she should contact us.  At this time, she does not want to take a PPI or H2, etc. And would like to stick to holistic tx at this time.

## 2015-06-04 ENCOUNTER — Telehealth: Payer: Self-pay

## 2015-06-04 NOTE — Telephone Encounter (Signed)
Tiffany Wood - Pt wanted to thank you and let you know you have been wonderful with her treatment.  She is going to go to her PCP because of the ongoing problem.  She just figures it will be best.  She wanted you to know how grateful she is.

## 2015-11-17 ENCOUNTER — Other Ambulatory Visit: Payer: Self-pay | Admitting: Obstetrics and Gynecology

## 2015-11-17 DIAGNOSIS — R928 Other abnormal and inconclusive findings on diagnostic imaging of breast: Secondary | ICD-10-CM

## 2015-11-24 ENCOUNTER — Ambulatory Visit
Admission: RE | Admit: 2015-11-24 | Discharge: 2015-11-24 | Disposition: A | Discharge status: Home / Self Care | Payer: 59 | Source: Ambulatory Visit | Attending: Obstetrics and Gynecology | Admitting: Obstetrics and Gynecology

## 2015-11-24 DIAGNOSIS — R928 Other abnormal and inconclusive findings on diagnostic imaging of breast: Secondary | ICD-10-CM

## 2016-01-16 ENCOUNTER — Other Ambulatory Visit: Payer: Self-pay | Admitting: Obstetrics and Gynecology

## 2016-01-16 DIAGNOSIS — N6310 Unspecified lump in the right breast, unspecified quadrant: Principal | ICD-10-CM

## 2016-01-16 DIAGNOSIS — N6315 Unspecified lump in the right breast, overlapping quadrants: Secondary | ICD-10-CM

## 2016-01-21 ENCOUNTER — Ambulatory Visit
Admission: RE | Admit: 2016-01-21 | Discharge: 2016-01-21 | Disposition: A | Discharge status: Home / Self Care | Payer: 59 | Source: Ambulatory Visit | Attending: Obstetrics and Gynecology | Admitting: Obstetrics and Gynecology

## 2016-01-21 DIAGNOSIS — N6315 Unspecified lump in the right breast, overlapping quadrants: Secondary | ICD-10-CM

## 2016-01-21 DIAGNOSIS — N6310 Unspecified lump in the right breast, unspecified quadrant: Principal | ICD-10-CM

## 2016-03-01 ENCOUNTER — Other Ambulatory Visit: Payer: Self-pay | Admitting: Obstetrics and Gynecology

## 2016-03-01 DIAGNOSIS — N6002 Solitary cyst of left breast: Secondary | ICD-10-CM

## 2016-03-04 ENCOUNTER — Ambulatory Visit
Admission: RE | Admit: 2016-03-04 | Discharge: 2016-03-04 | Disposition: A | Discharge status: Home / Self Care | Payer: 59 | Source: Ambulatory Visit | Attending: Obstetrics and Gynecology | Admitting: Obstetrics and Gynecology

## 2016-03-04 DIAGNOSIS — N6002 Solitary cyst of left breast: Secondary | ICD-10-CM

## 2016-04-28 ENCOUNTER — Other Ambulatory Visit: Payer: Self-pay | Admitting: Physical Medicine and Rehabilitation

## 2016-04-28 ENCOUNTER — Ambulatory Visit
Admission: RE | Admit: 2016-04-28 | Discharge: 2016-04-28 | Disposition: A | Discharge status: Home / Self Care | Payer: 59 | Source: Ambulatory Visit | Attending: Physical Medicine and Rehabilitation | Admitting: Physical Medicine and Rehabilitation

## 2016-04-28 DIAGNOSIS — M545 Low back pain: Secondary | ICD-10-CM

## 2016-04-28 DIAGNOSIS — M25552 Pain in left hip: Secondary | ICD-10-CM

## 2018-07-11 DIAGNOSIS — Z1231 Encounter for screening mammogram for malignant neoplasm of breast: Secondary | ICD-10-CM | POA: Diagnosis not present

## 2018-07-11 DIAGNOSIS — Z13820 Encounter for screening for osteoporosis: Secondary | ICD-10-CM | POA: Diagnosis not present

## 2018-07-11 DIAGNOSIS — Z1212 Encounter for screening for malignant neoplasm of rectum: Secondary | ICD-10-CM | POA: Diagnosis not present

## 2018-07-11 DIAGNOSIS — Z6831 Body mass index (BMI) 31.0-31.9, adult: Secondary | ICD-10-CM | POA: Diagnosis not present

## 2018-07-11 DIAGNOSIS — M8588 Other specified disorders of bone density and structure, other site: Secondary | ICD-10-CM | POA: Diagnosis not present

## 2018-07-11 DIAGNOSIS — Z01419 Encounter for gynecological examination (general) (routine) without abnormal findings: Secondary | ICD-10-CM | POA: Diagnosis not present

## 2020-01-22 DIAGNOSIS — Z1231 Encounter for screening mammogram for malignant neoplasm of breast: Secondary | ICD-10-CM | POA: Diagnosis not present

## 2020-01-22 DIAGNOSIS — Z01419 Encounter for gynecological examination (general) (routine) without abnormal findings: Secondary | ICD-10-CM | POA: Diagnosis not present

## 2020-01-22 DIAGNOSIS — Z6831 Body mass index (BMI) 31.0-31.9, adult: Secondary | ICD-10-CM | POA: Diagnosis not present

## 2020-01-25 DIAGNOSIS — M67412 Ganglion, left shoulder: Secondary | ICD-10-CM | POA: Diagnosis not present

## 2020-12-16 DIAGNOSIS — H00011 Hordeolum externum right upper eyelid: Secondary | ICD-10-CM | POA: Diagnosis not present

## 2021-06-26 ENCOUNTER — Other Ambulatory Visit: Payer: Self-pay | Admitting: Family Medicine

## 2021-06-26 ENCOUNTER — Other Ambulatory Visit: Payer: Self-pay

## 2021-06-26 ENCOUNTER — Ambulatory Visit
Admission: RE | Admit: 2021-06-26 | Discharge: 2021-06-26 | Disposition: A | Discharge status: Home / Self Care | Payer: 59 | Source: Ambulatory Visit | Attending: Family Medicine | Admitting: Family Medicine

## 2021-06-26 DIAGNOSIS — R053 Chronic cough: Secondary | ICD-10-CM | POA: Diagnosis not present

## 2021-06-26 DIAGNOSIS — R0602 Shortness of breath: Secondary | ICD-10-CM | POA: Diagnosis not present

## 2021-06-26 DIAGNOSIS — R059 Cough, unspecified: Secondary | ICD-10-CM | POA: Diagnosis not present

## 2021-09-22 ENCOUNTER — Other Ambulatory Visit: Payer: Self-pay | Admitting: Obstetrics and Gynecology

## 2021-09-22 DIAGNOSIS — Z6832 Body mass index (BMI) 32.0-32.9, adult: Secondary | ICD-10-CM | POA: Diagnosis not present

## 2021-09-22 DIAGNOSIS — Z01419 Encounter for gynecological examination (general) (routine) without abnormal findings: Secondary | ICD-10-CM | POA: Diagnosis not present

## 2021-09-22 DIAGNOSIS — Z13820 Encounter for screening for osteoporosis: Secondary | ICD-10-CM | POA: Diagnosis not present

## 2021-09-22 DIAGNOSIS — N644 Mastodynia: Secondary | ICD-10-CM

## 2021-09-22 DIAGNOSIS — R102 Pelvic and perineal pain: Secondary | ICD-10-CM | POA: Diagnosis not present

## 2021-09-22 DIAGNOSIS — R208 Other disturbances of skin sensation: Secondary | ICD-10-CM

## 2021-10-06 ENCOUNTER — Other Ambulatory Visit: Payer: Self-pay | Admitting: Obstetrics and Gynecology

## 2021-10-06 ENCOUNTER — Ambulatory Visit
Admission: RE | Admit: 2021-10-06 | Discharge: 2021-10-06 | Disposition: A | Discharge status: Home / Self Care | Payer: BC Managed Care – PPO | Source: Ambulatory Visit | Attending: Obstetrics and Gynecology | Admitting: Obstetrics and Gynecology

## 2021-10-06 ENCOUNTER — Other Ambulatory Visit: Payer: Self-pay

## 2021-10-06 DIAGNOSIS — R208 Other disturbances of skin sensation: Secondary | ICD-10-CM

## 2021-10-06 DIAGNOSIS — R922 Inconclusive mammogram: Secondary | ICD-10-CM | POA: Diagnosis not present

## 2021-10-06 DIAGNOSIS — N644 Mastodynia: Secondary | ICD-10-CM | POA: Diagnosis not present

## 2021-10-06 DIAGNOSIS — N6320 Unspecified lump in the left breast, unspecified quadrant: Secondary | ICD-10-CM

## 2022-04-07 ENCOUNTER — Other Ambulatory Visit: Payer: BC Managed Care – PPO

## 2022-04-21 ENCOUNTER — Ambulatory Visit
Admission: RE | Admit: 2022-04-21 | Discharge: 2022-04-21 | Disposition: A | Discharge status: Home / Self Care | Payer: BC Managed Care – PPO | Source: Ambulatory Visit | Attending: Obstetrics and Gynecology | Admitting: Obstetrics and Gynecology

## 2022-04-21 ENCOUNTER — Other Ambulatory Visit: Payer: Self-pay | Admitting: Obstetrics and Gynecology

## 2022-04-21 DIAGNOSIS — N6320 Unspecified lump in the left breast, unspecified quadrant: Secondary | ICD-10-CM

## 2022-04-21 DIAGNOSIS — N6489 Other specified disorders of breast: Secondary | ICD-10-CM | POA: Diagnosis not present

## 2022-05-04 DIAGNOSIS — H6692 Otitis media, unspecified, left ear: Secondary | ICD-10-CM | POA: Diagnosis not present

## 2022-06-03 DIAGNOSIS — H9203 Otalgia, bilateral: Secondary | ICD-10-CM | POA: Diagnosis not present

## 2022-06-03 DIAGNOSIS — J3489 Other specified disorders of nose and nasal sinuses: Secondary | ICD-10-CM | POA: Diagnosis not present

## 2022-06-11 DIAGNOSIS — H9203 Otalgia, bilateral: Secondary | ICD-10-CM | POA: Diagnosis not present

## 2022-06-11 DIAGNOSIS — J3489 Other specified disorders of nose and nasal sinuses: Secondary | ICD-10-CM | POA: Diagnosis not present

## 2022-06-11 DIAGNOSIS — J329 Chronic sinusitis, unspecified: Secondary | ICD-10-CM | POA: Diagnosis not present

## 2022-10-27 ENCOUNTER — Ambulatory Visit
Admission: RE | Admit: 2022-10-27 | Discharge: 2022-10-27 | Disposition: A | Discharge status: Home / Self Care | Payer: BC Managed Care – PPO | Source: Ambulatory Visit | Attending: Obstetrics and Gynecology | Admitting: Obstetrics and Gynecology

## 2022-10-27 ENCOUNTER — Other Ambulatory Visit: Payer: Self-pay | Admitting: Obstetrics and Gynecology

## 2022-10-27 DIAGNOSIS — N6320 Unspecified lump in the left breast, unspecified quadrant: Secondary | ICD-10-CM

## 2022-10-27 DIAGNOSIS — N6321 Unspecified lump in the left breast, upper outer quadrant: Secondary | ICD-10-CM | POA: Diagnosis not present

## 2022-10-27 DIAGNOSIS — R921 Mammographic calcification found on diagnostic imaging of breast: Secondary | ICD-10-CM | POA: Diagnosis not present

## 2022-11-01 ENCOUNTER — Other Ambulatory Visit: Payer: Self-pay | Admitting: Obstetrics and Gynecology

## 2022-11-01 DIAGNOSIS — R921 Mammographic calcification found on diagnostic imaging of breast: Secondary | ICD-10-CM

## 2022-11-10 ENCOUNTER — Ambulatory Visit
Admission: RE | Admit: 2022-11-10 | Discharge: 2022-11-10 | Disposition: A | Discharge status: Home / Self Care | Payer: BC Managed Care – PPO | Source: Ambulatory Visit | Attending: Obstetrics and Gynecology | Admitting: Obstetrics and Gynecology

## 2022-11-10 DIAGNOSIS — N6012 Diffuse cystic mastopathy of left breast: Secondary | ICD-10-CM | POA: Diagnosis not present

## 2022-11-10 DIAGNOSIS — R921 Mammographic calcification found on diagnostic imaging of breast: Secondary | ICD-10-CM

## 2022-11-10 HISTORY — PX: BREAST BIOPSY: SHX20

## 2023-01-28 DIAGNOSIS — M546 Pain in thoracic spine: Secondary | ICD-10-CM | POA: Diagnosis not present

## 2023-01-28 DIAGNOSIS — M542 Cervicalgia: Secondary | ICD-10-CM | POA: Diagnosis not present

## 2023-04-06 DIAGNOSIS — Z124 Encounter for screening for malignant neoplasm of cervix: Secondary | ICD-10-CM | POA: Diagnosis not present

## 2023-04-06 DIAGNOSIS — Z01419 Encounter for gynecological examination (general) (routine) without abnormal findings: Secondary | ICD-10-CM | POA: Diagnosis not present

## 2023-04-06 DIAGNOSIS — Z6833 Body mass index (BMI) 33.0-33.9, adult: Secondary | ICD-10-CM | POA: Diagnosis not present

## 2023-05-05 DIAGNOSIS — D259 Leiomyoma of uterus, unspecified: Secondary | ICD-10-CM | POA: Diagnosis not present

## 2023-05-05 DIAGNOSIS — R102 Pelvic and perineal pain: Secondary | ICD-10-CM | POA: Diagnosis not present

## 2023-05-25 DIAGNOSIS — H00012 Hordeolum externum right lower eyelid: Secondary | ICD-10-CM | POA: Diagnosis not present

## 2023-05-28 IMAGING — CR DG CHEST 2V
2 series · 2 of 2 positions shown · non-contrast
Comparison: None.

CLINICAL DATA: Chronic cough. Cough and shortness of breath for 5
months. Productive over the last month.

EXAM:
CHEST - 2 VIEW

[w chest pa]
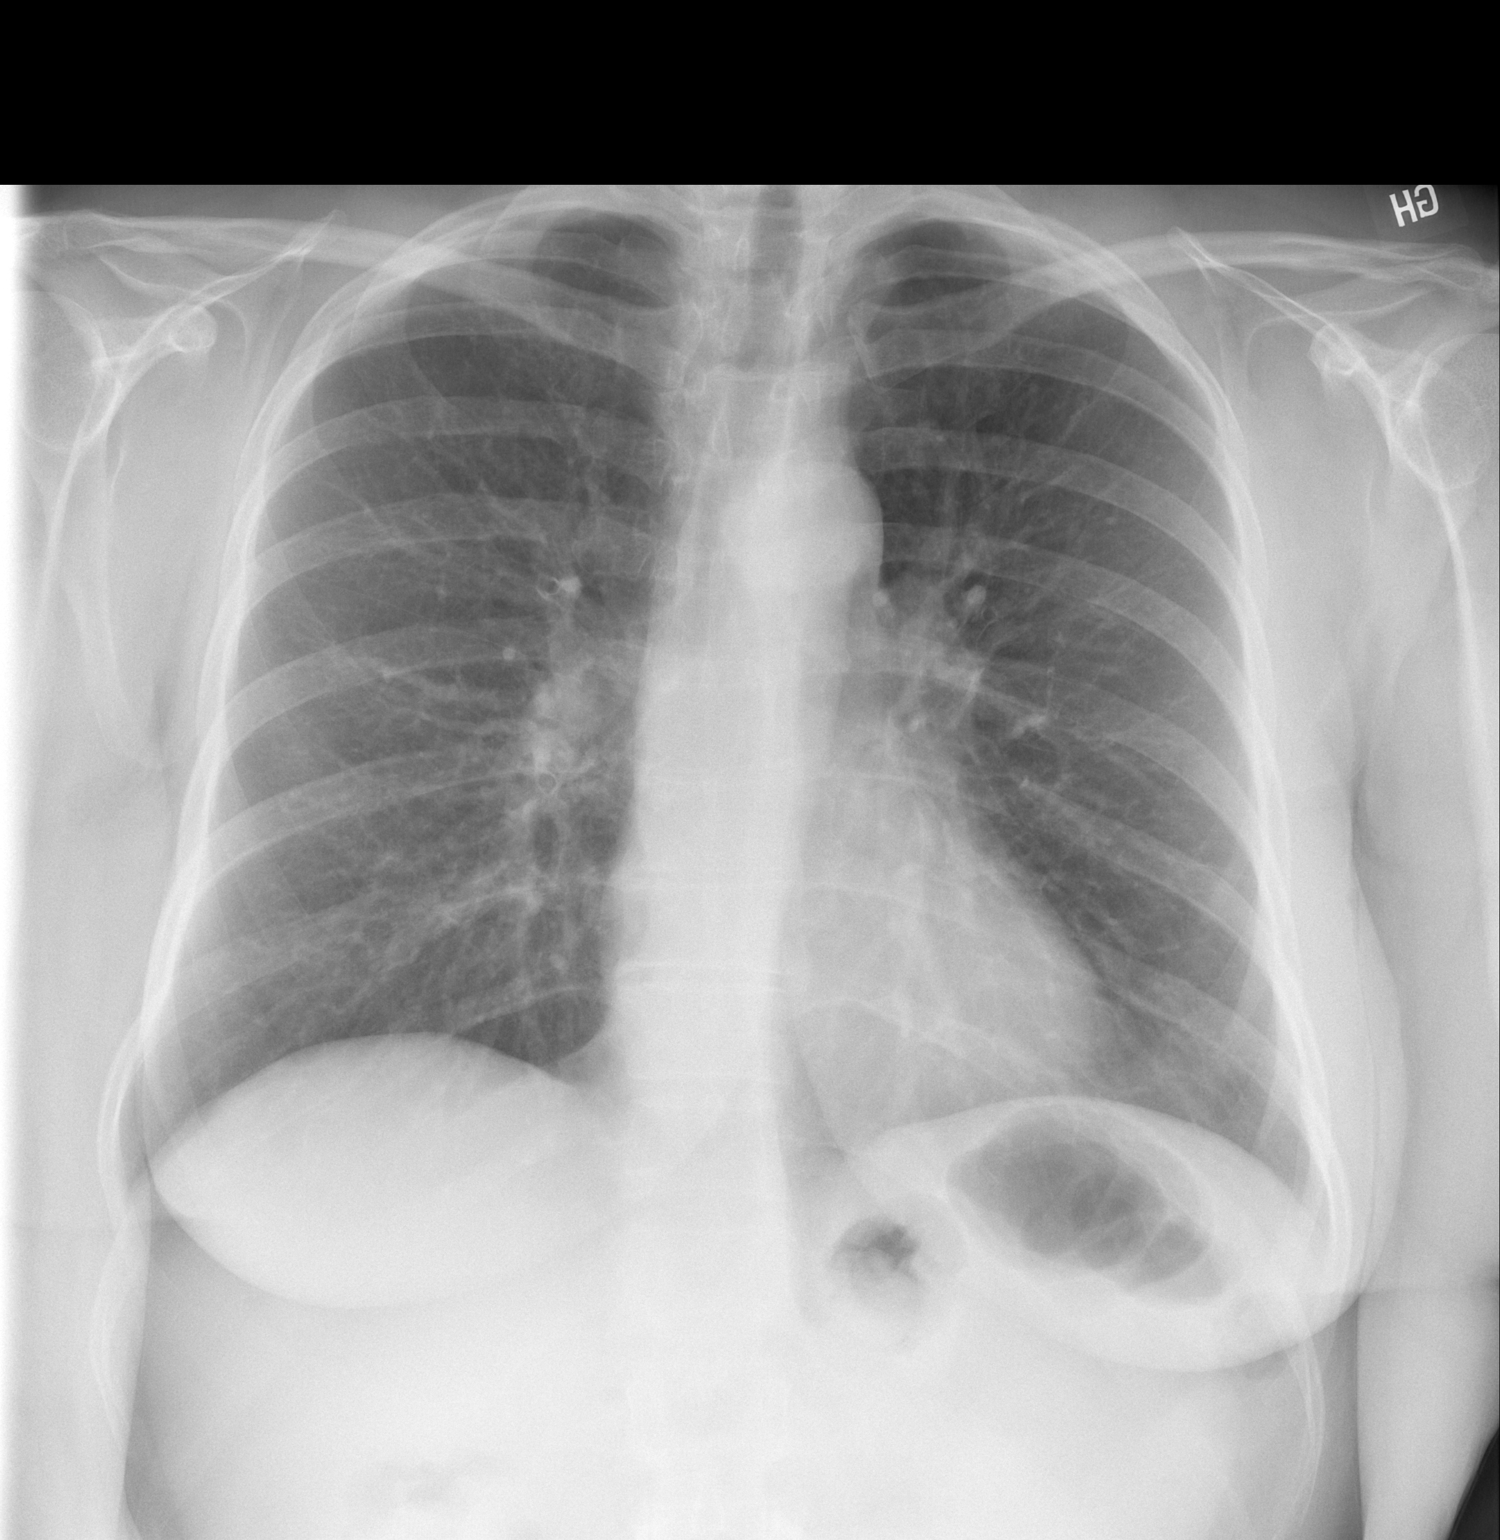

[w chest lat]
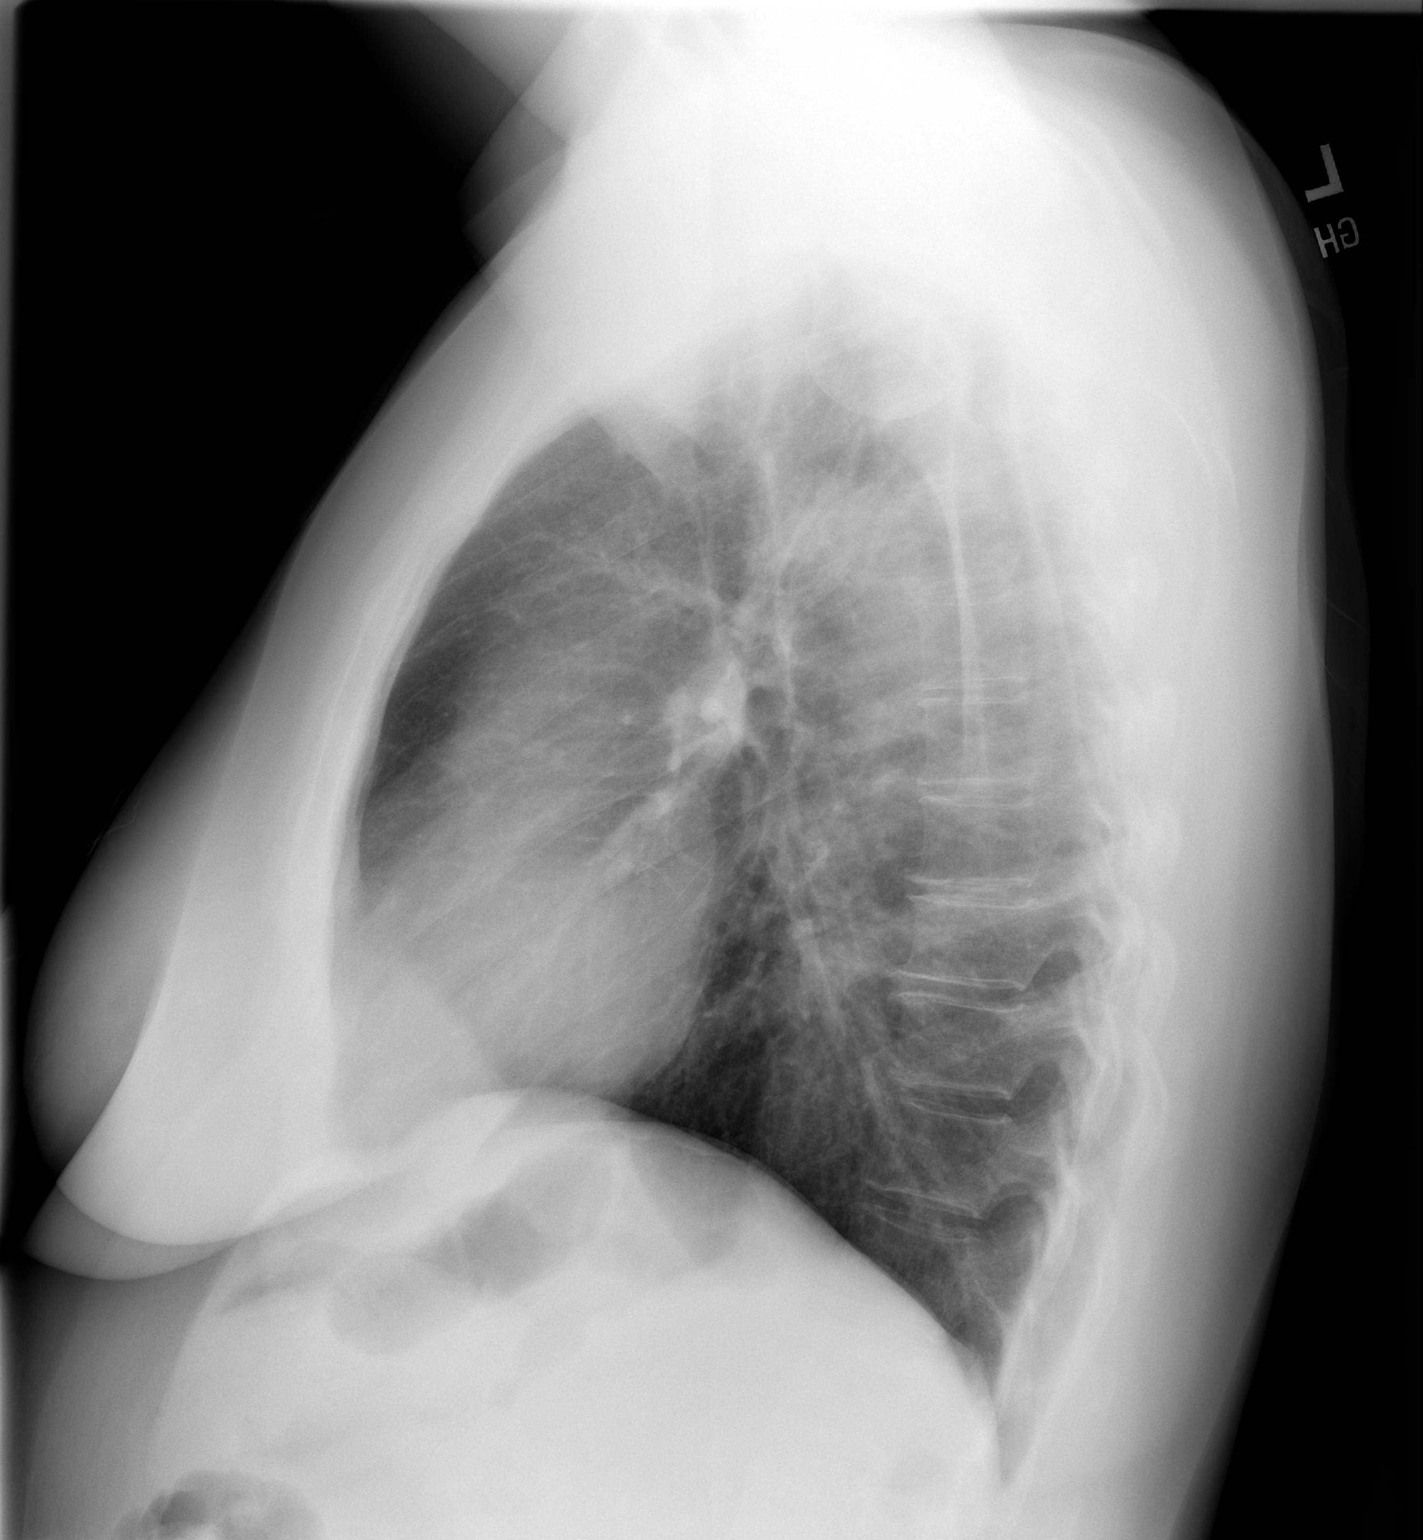

[2 of 2 positions shown; findings below may reference images not displayed]

FINDINGS: The heart size and mediastinal contours are within normal limits.
Both lungs are clear. The visualized skeletal structures are
unremarkable.
IMPRESSION: No active cardiopulmonary disease.

## 2024-01-11 DIAGNOSIS — M25531 Pain in right wrist: Secondary | ICD-10-CM | POA: Diagnosis not present

## 2024-01-11 DIAGNOSIS — M25561 Pain in right knee: Secondary | ICD-10-CM | POA: Diagnosis not present

## 2024-02-01 DIAGNOSIS — M25561 Pain in right knee: Secondary | ICD-10-CM | POA: Diagnosis not present

## 2024-02-01 DIAGNOSIS — M25531 Pain in right wrist: Secondary | ICD-10-CM | POA: Diagnosis not present

## 2024-04-24 DIAGNOSIS — D170 Benign lipomatous neoplasm of skin and subcutaneous tissue of head, face and neck: Secondary | ICD-10-CM | POA: Diagnosis not present

## 2024-04-24 DIAGNOSIS — D171 Benign lipomatous neoplasm of skin and subcutaneous tissue of trunk: Secondary | ICD-10-CM | POA: Diagnosis not present

## 2024-07-31 ENCOUNTER — Ambulatory Visit (INDEPENDENT_AMBULATORY_CARE_PROVIDER_SITE_OTHER): Admitting: Plastic Surgery

## 2024-07-31 ENCOUNTER — Encounter: Payer: Self-pay | Admitting: Plastic Surgery

## 2024-07-31 DIAGNOSIS — R222 Localized swelling, mass and lump, trunk: Secondary | ICD-10-CM | POA: Diagnosis not present

## 2024-07-31 NOTE — H&P (View-Only) (Signed)
     Patient ID: Tiffany Wood, female    DOB: 12-Jun-1971, 53 y.o.   MRN: [id number]   Chief Complaint  Patient presents with   Consult         The patient is a 53 year old female here for evaluation of 2 areas.  She has a 5 x 6 area of mass on the back of her neck upper thoracic area.  The other is in the midline of her back right where her bra strap crosses.  That 1 is about 2 x 4 cm.  They feel like lipomas.  They are soft and slightly movable.  They have been getting larger over the past couple years and now she is starting to get some pain from the area.  Due to the fact that these can become invasive it is a good idea to have them removed.      Review of Systems  Constitutional: Negative.   Eyes: Negative.   Respiratory: Negative.    Cardiovascular: Negative.   Gastrointestinal: Negative.   Endocrine: Negative.   Genitourinary: Negative.   Musculoskeletal: Negative.     Past Medical History:  Diagnosis Date   Anemia     Past Surgical History:  Procedure Laterality Date   ablasion   11/2014   BREAST BIOPSY Left 11/10/2022   MM LT BREAST BX W LOC DEV 1ST LESION IMAGE BX SPEC STEREO GUIDE 11/10/2022 [facility]   CESAREAN SECTION     HERNIA REPAIR        Current Outpatient Medications:    fluconazole  (DIFLUCAN ) 150 MG tablet, Take 1 tablet (150 mg total) by mouth once. Repeat in 1 wk if needed (Patient not taking: Reported on 05/20/2015), Disp: 2 tablet, Rfl: 0   ketoconazole  (NIZORAL ) 2 % cream, Apply topically daily. (Patient not taking: Reported on 05/20/2015), Disp: 60 g, Rfl: 0   Objective:   There were no vitals filed for this visit.  Physical Exam Vitals and nursing note reviewed.  HENT:     Head: Atraumatic.  Cardiovascular:     Rate and Rhythm: Normal rate.     Pulses: Normal pulses.  Musculoskeletal:       Arms:  Skin:    General: Skin is warm.     Capillary Refill: Capillary refill takes less than 2 seconds.     Coloration: Skin is not  jaundiced.     Findings: Lesion present. No bruising.  Neurological:     Mental Status: She is alert and oriented to person, place, and time.  Psychiatric:        Mood and Affect: Mood normal.        Behavior: Behavior normal.        Thought Content: Thought content normal.        Judgment: Judgment normal.     Assessment & Plan:  Mass on back  Plan for excision of back mass cyst x 2  Pictures were obtained of the patient and placed in the chart with the patient's or guardian's permission.   Tiffany RAMAN Javeon Macmurray, DO

## 2024-07-31 NOTE — Progress Notes (Signed)
     Patient ID: Tiffany Wood, female    DOB: 12-Jun-1971, 53 y.o.   MRN: [id number]   Chief Complaint  Patient presents with   Consult         The patient is a 53 year old female here for evaluation of 2 areas.  She has a 5 x 6 area of mass on the back of her neck upper thoracic area.  The other is in the midline of her back right where her bra strap crosses.  That 1 is about 2 x 4 cm.  They feel like lipomas.  They are soft and slightly movable.  They have been getting larger over the past couple years and now she is starting to get some pain from the area.  Due to the fact that these can become invasive it is a good idea to have them removed.      Review of Systems  Constitutional: Negative.   Eyes: Negative.   Respiratory: Negative.    Cardiovascular: Negative.   Gastrointestinal: Negative.   Endocrine: Negative.   Genitourinary: Negative.   Musculoskeletal: Negative.     Past Medical History:  Diagnosis Date   Anemia     Past Surgical History:  Procedure Laterality Date   ablasion   11/2014   BREAST BIOPSY Left 11/10/2022   MM LT BREAST BX W LOC DEV 1ST LESION IMAGE BX SPEC STEREO GUIDE 11/10/2022 [facility]   CESAREAN SECTION     HERNIA REPAIR        Current Outpatient Medications:    fluconazole  (DIFLUCAN ) 150 MG tablet, Take 1 tablet (150 mg total) by mouth once. Repeat in 1 wk if needed (Patient not taking: Reported on 05/20/2015), Disp: 2 tablet, Rfl: 0   ketoconazole  (NIZORAL ) 2 % cream, Apply topically daily. (Patient not taking: Reported on 05/20/2015), Disp: 60 g, Rfl: 0   Objective:   There were no vitals filed for this visit.  Physical Exam Vitals and nursing note reviewed.  HENT:     Head: Atraumatic.  Cardiovascular:     Rate and Rhythm: Normal rate.     Pulses: Normal pulses.  Musculoskeletal:       Arms:  Skin:    General: Skin is warm.     Capillary Refill: Capillary refill takes less than 2 seconds.     Coloration: Skin is not  jaundiced.     Findings: Lesion present. No bruising.  Neurological:     Mental Status: She is alert and oriented to person, place, and time.  Psychiatric:        Mood and Affect: Mood normal.        Behavior: Behavior normal.        Thought Content: Thought content normal.        Judgment: Judgment normal.     Assessment & Plan:  Mass on back  Plan for excision of back mass cyst x 2  Pictures were obtained of the patient and placed in the chart with the patient's or guardian's permission.   Tiffany RAMAN Javeon Macmurray, DO

## 2024-08-20 ENCOUNTER — Ambulatory Visit: Admitting: Student

## 2024-08-20 DIAGNOSIS — R222 Localized swelling, mass and lump, trunk: Secondary | ICD-10-CM

## 2024-08-20 MED ORDER — CEPHALEXIN 500 MG PO CAPS: 500.0000 mg | ORAL_CAPSULE | Freq: Four times a day (QID) | ORAL | 0 refills | Status: AC

## 2024-08-20 MED ORDER — ONDANSETRON HCL 4 MG PO TABS: 4.0000 mg | ORAL_TABLET | Freq: Three times a day (TID) | ORAL | 0 refills | Status: DC | PRN

## 2024-08-20 MED ORDER — TRAMADOL HCL 50 MG PO TABS: 50.0000 mg | ORAL_TABLET | Freq: Three times a day (TID) | ORAL | 0 refills | Status: DC | PRN

## 2024-08-20 NOTE — Progress Notes (Signed)
 Patient ID: Tiffany Wood, female    DOB: Sep 28, 1971, 53 y.o.   MRN: [id number]  Preoperative Appointment     ICD-10-CM   1. Mass on back  R22.2        History of Present Illness: Tiffany Wood is a 53 y.o.  female  with a history of back masses.  She presents for preoperative evaluation for upcoming procedure, excision of back masses x 2, scheduled for 08/30/2024 with Dr. Lowery.  Patient reports that she has had anesthesia in the past and states that she has taken a while to wake up.  She otherwise denies any issues with anesthesia.  Patient denies any history of cardiac disease.  She denies taking any blood thinners.  Patient reports she is not a smoker.  Patient denies taking any birth control or hormone replacement.  She denies any history of miscarriages.  She denies any personal family history of blood clots or clotting diseases.  Patient denies any recent surgeries, or traumas.  She does report that about 5 weeks ago, she had COVID, but this has resolved. She denies any history of stroke or heart attack.  She denies any history of Crohn's disease or ulcerative colitis, COPD or asthma.  She denies any history of cancer.  She denies any varicosities to her lower extremities.  She otherwise denies any recent fevers, chills or changes in her health.  Summary of Previous Visit: Patient was seen for consult by Dr. Lowery on 07/31/2024.  At this visit, patient had a 5 x 6 area of mass on the back of her neck/upper thoracic area.  There is another mass to the midline of her back right where her bra strap crossed.  This was about 2 x 4 cm.  On exam, they felt like lipomas, they were soft and slightly movable.  Patient reported that they had been getting larger over the past couple years and was starting to get some pain from the area.  Plan was to move forward with excision of the masses.  Job: Works part-time from home, planning to take 1 week off  PMH Significant for: Mass of the  buttock   Past Medical History: Allergies: Allergies  Allergen Reactions   Codeine Nausea Only    Current Medications:  Current Outpatient Medications:    cephALEXin  (KEFLEX ) 500 MG capsule, Take 1 capsule (500 mg total) by mouth 4 (four) times daily for 3 days., Disp: 12 capsule, Rfl: 0   ondansetron  (ZOFRAN ) 4 MG tablet, Take 1 tablet (4 mg total) by mouth every 8 (eight) hours as needed for up to 10 doses for nausea or vomiting., Disp: 10 tablet, Rfl: 0   traMADol  (ULTRAM ) 50 MG tablet, Take 1 tablet (50 mg total) by mouth every 8 (eight) hours as needed for up to 10 doses for moderate pain (pain score 4-6) or severe pain (pain score 7-10)., Disp: 10 tablet, Rfl: 0   fluconazole  (DIFLUCAN ) 150 MG tablet, Take 1 tablet (150 mg total) by mouth once. Repeat in 1 wk if needed (Patient not taking: Reported on 05/20/2015), Disp: 2 tablet, Rfl: 0   ketoconazole  (NIZORAL ) 2 % cream, Apply topically daily. (Patient not taking: Reported on 05/20/2015), Disp: 60 g, Rfl: 0  Past Medical Problems: Past Medical History:  Diagnosis Date   Anemia     Past Surgical History: Past Surgical History:  Procedure Laterality Date   ablasion   11/2014   BREAST BIOPSY Left 11/10/2022   MM LT  BREAST BX W LOC DEV 1ST LESION IMAGE BX SPEC STEREO GUIDE 11/10/2022 [facility]   CESAREAN SECTION     HERNIA REPAIR      Social History: Social History   Socioeconomic History   Marital status: Married    Spouse name: Not on file   Number of children: Not on file   Years of education: Not on file   Highest education level: Not on file  Occupational History   Not on file  Tobacco Use   Smoking status: Never   Smokeless tobacco: Not on file  Substance and Sexual Activity   Alcohol use: Yes   Drug use: No   Sexual activity: Yes    Birth control/protection: Surgical  Other Topics Concern   Not on file  Social History Narrative   Not on file   Social Drivers of Health   Financial Resource  Strain: Not on file  Food Insecurity: Not on file  Transportation Needs: Not on file  Physical Activity: Not on file  Stress: Not on file  Social Connections: Not on file  Intimate Partner Violence: Not on file    Family History: Family History  Problem Relation Age of Onset   Thyroid  disease Mother    Hypertension Mother    Hypertension Father    Arthritis Father     Review of Systems: Denies any fevers or chills  Physical Exam: Patient is speaking in full and clear sentences   Assessment/Plan: The patient is scheduled for excision of back masses x 2 with Dr. Lowery.  Risks, benefits, and alternatives of procedure discussed, questions answered and consent obtained.    Smoking Status: Non-smoker; Counseling Given?  N/A  Caprini Score: 3; Risk Factors include: Age and length of planned surgery. Recommendation for mechanical prophylaxis. Encourage early ambulation.   Pictures obtained: @consult   Post-op Rx sent to pharmacy: Keflex , Zofran , tramadol -patient states that she has taken oxycodone in the past and this makes her nauseous.    Instructed patient to hold any multivitamins or supplements at least 1 week prior to surgery.  Patient expressed understanding.  Patient was provided with the General Surgical Risk consent document and Pain Medication Agreement prior to their appointment.  They had adequate time to read through the risk consent documents and Pain Medication Agreement. We also discussed them in person together during this preop appointment. All of their questions were answered to their satisfaction.  Recommended calling if they have any further questions.  Risk consent form and Pain Medication Agreement to be scanned into patient's chart.  The consent was obtained with risks and complications reviewed which included bleeding, pain, scar, infection and the risk of anesthesia.  The patients questions were answered to the patients expressed satisfaction.   The  patient gave consent to have this visit done by telemedicine / virtual visit, two identifiers were used to identify patient. This is also consent for access the chart and treat the patient via this visit. The patient is located in [place] .  I, the provider, am at the office.  We spent 25 minutes together for the visit.  Joined by telephone.    Electronically signed by: Estefana FORBES Peck, PA-C 08/20/2024 11:34 AM

## 2024-08-23 ENCOUNTER — Encounter (HOSPITAL_BASED_OUTPATIENT_CLINIC_OR_DEPARTMENT_OTHER): Payer: Self-pay | Admitting: Plastic Surgery

## 2024-08-23 ENCOUNTER — Other Ambulatory Visit: Payer: Self-pay

## 2024-08-30 ENCOUNTER — Encounter (HOSPITAL_BASED_OUTPATIENT_CLINIC_OR_DEPARTMENT_OTHER)
Admission: RE | Disposition: A | Discharge status: Home / Self Care | Payer: Self-pay | Source: Home / Self Care | Attending: Plastic Surgery

## 2024-08-30 ENCOUNTER — Encounter (HOSPITAL_BASED_OUTPATIENT_CLINIC_OR_DEPARTMENT_OTHER): Payer: Self-pay | Admitting: Plastic Surgery

## 2024-08-30 ENCOUNTER — Other Ambulatory Visit: Payer: Self-pay

## 2024-08-30 ENCOUNTER — Ambulatory Visit (HOSPITAL_BASED_OUTPATIENT_CLINIC_OR_DEPARTMENT_OTHER)
Admission: RE | Admit: 2024-08-30 | Discharge: 2024-08-30 | Disposition: A | Discharge status: Home / Self Care | Attending: Plastic Surgery | Admitting: Plastic Surgery

## 2024-08-30 ENCOUNTER — Ambulatory Visit (HOSPITAL_BASED_OUTPATIENT_CLINIC_OR_DEPARTMENT_OTHER): Payer: Self-pay | Admitting: Anesthesiology

## 2024-08-30 DIAGNOSIS — D171 Benign lipomatous neoplasm of skin and subcutaneous tissue of trunk: Secondary | ICD-10-CM | POA: Diagnosis not present

## 2024-08-30 DIAGNOSIS — D170 Benign lipomatous neoplasm of skin and subcutaneous tissue of head, face and neck: Secondary | ICD-10-CM | POA: Diagnosis not present

## 2024-08-30 DIAGNOSIS — R222 Localized swelling, mass and lump, trunk: Secondary | ICD-10-CM | POA: Diagnosis not present

## 2024-08-30 DIAGNOSIS — Z01818 Encounter for other preprocedural examination: Secondary | ICD-10-CM

## 2024-08-30 LAB — POCT PREGNANCY, URINE: Preg Test, Ur: NEGATIVE

## 2024-08-30 SURGERY — EXCISION MASS, BACK
Anesthesia: General | Site: Back

## 2024-08-30 MED ORDER — ONDANSETRON HCL 4 MG/2ML IJ SOLN
INTRAMUSCULAR | Status: DC | PRN
  Administered 2024-08-30: 4 mg via INTRAVENOUS

## 2024-08-30 MED ORDER — ATROPINE SULFATE 0.4 MG/ML IV SOLN
INTRAVENOUS | Status: AC
  Filled 2024-08-30: qty 1

## 2024-08-30 MED ORDER — OXYCODONE HCL 5 MG PO TABS: 5.0000 mg | ORAL_TABLET | ORAL | Status: DC | PRN

## 2024-08-30 MED ORDER — SODIUM CHLORIDE 0.9 % IV SOLN: 250.0000 mL | INTRAVENOUS | Status: DC | PRN

## 2024-08-30 MED ORDER — CHLORHEXIDINE GLUCONATE CLOTH 2 % EX PADS: 6.0000 | MEDICATED_PAD | Freq: Once | CUTANEOUS | Status: DC

## 2024-08-30 MED ORDER — MIDAZOLAM HCL 2 MG/2ML IJ SOLN
INTRAMUSCULAR | Status: AC
  Filled 2024-08-30: qty 2

## 2024-08-30 MED ORDER — DROPERIDOL 2.5 MG/ML IJ SOLN: 0.6250 mg | Freq: Once | INTRAMUSCULAR | Status: DC | PRN

## 2024-08-30 MED ORDER — SUGAMMADEX SODIUM 200 MG/2ML IV SOLN
INTRAVENOUS | Status: DC | PRN
  Administered 2024-08-30: 200 mg via INTRAVENOUS

## 2024-08-30 MED ORDER — BACITRACIN ZINC 500 UNIT/GM EX OINT
TOPICAL_OINTMENT | CUTANEOUS | Status: AC
  Filled 2024-08-30: qty 28.35

## 2024-08-30 MED ORDER — ACETAMINOPHEN 500 MG PO TABS
1000.0000 mg | ORAL_TABLET | Freq: Once | ORAL | Status: AC
  Administered 2024-08-30: 1000 mg via ORAL

## 2024-08-30 MED ORDER — BUPIVACAINE HCL (PF) 0.25 % IJ SOLN
INTRAMUSCULAR | Status: AC
  Filled 2024-08-30: qty 30

## 2024-08-30 MED ORDER — MIDAZOLAM HCL 5 MG/5ML IJ SOLN
INTRAMUSCULAR | Status: DC | PRN
  Administered 2024-08-30: 2 mg via INTRAVENOUS

## 2024-08-30 MED ORDER — PHENYLEPHRINE 80 MCG/ML (10ML) SYRINGE FOR IV PUSH (FOR BLOOD PRESSURE SUPPORT)
PREFILLED_SYRINGE | INTRAVENOUS | Status: AC
Start: 2024-08-30 — End: 2024-08-30
  Filled 2024-08-30: qty 10

## 2024-08-30 MED ORDER — FENTANYL CITRATE (PF) 100 MCG/2ML IJ SOLN: 25.0000 ug | INTRAMUSCULAR | Status: DC | PRN

## 2024-08-30 MED ORDER — SODIUM CHLORIDE 0.9% FLUSH: 3.0000 mL | Freq: Two times a day (BID) | INTRAVENOUS | Status: DC

## 2024-08-30 MED ORDER — LIDOCAINE 2% (20 MG/ML) 5 ML SYRINGE
INTRAMUSCULAR | Status: AC
  Filled 2024-08-30: qty 5

## 2024-08-30 MED ORDER — LIDOCAINE HCL (CARDIAC) PF 100 MG/5ML IV SOSY
PREFILLED_SYRINGE | INTRAVENOUS | Status: DC | PRN
  Administered 2024-08-30: 100 mg via INTRAVENOUS

## 2024-08-30 MED ORDER — FENTANYL CITRATE (PF) 100 MCG/2ML IJ SOLN
INTRAMUSCULAR | Status: AC
  Filled 2024-08-30: qty 2

## 2024-08-30 MED ORDER — BUPIVACAINE-EPINEPHRINE (PF) 0.25% -1:200000 IJ SOLN
INTRAMUSCULAR | Status: AC
Start: 2024-08-30 — End: 2024-08-30
  Filled 2024-08-30: qty 30

## 2024-08-30 MED ORDER — ROCURONIUM BROMIDE 10 MG/ML (PF) SYRINGE
PREFILLED_SYRINGE | INTRAVENOUS | Status: AC
Start: 2024-08-30 — End: 2024-08-30
  Filled 2024-08-30: qty 10

## 2024-08-30 MED ORDER — ACETAMINOPHEN 500 MG PO TABS
ORAL_TABLET | ORAL | Status: AC
  Filled 2024-08-30: qty 2

## 2024-08-30 MED ORDER — OXYCODONE HCL 5 MG PO TABS: 5.0000 mg | ORAL_TABLET | Freq: Once | ORAL | Status: DC | PRN

## 2024-08-30 MED ORDER — FENTANYL CITRATE (PF) 100 MCG/2ML IJ SOLN
INTRAMUSCULAR | Status: DC | PRN
  Administered 2024-08-30: 50 ug via INTRAVENOUS

## 2024-08-30 MED ORDER — PROPOFOL 10 MG/ML IV BOLUS
INTRAVENOUS | Status: DC | PRN
  Administered 2024-08-30: 180 mg via INTRAVENOUS

## 2024-08-30 MED ORDER — SUCCINYLCHOLINE CHLORIDE 200 MG/10ML IV SOSY
PREFILLED_SYRINGE | INTRAVENOUS | Status: AC
  Filled 2024-08-30: qty 10

## 2024-08-30 MED ORDER — PHENYLEPHRINE HCL (PRESSORS) 10 MG/ML IV SOLN
INTRAVENOUS | Status: DC | PRN
Start: 2024-08-30 — End: 2024-08-30
  Administered 2024-08-30: 80 ug via INTRAVENOUS

## 2024-08-30 MED ORDER — OXYCODONE HCL 5 MG/5ML PO SOLN: 5.0000 mg | Freq: Once | ORAL | Status: DC | PRN

## 2024-08-30 MED ORDER — ROCURONIUM BROMIDE 100 MG/10ML IV SOLN
INTRAVENOUS | Status: DC | PRN
  Administered 2024-08-30: 50 mg via INTRAVENOUS

## 2024-08-30 MED ORDER — SODIUM CHLORIDE 0.9% FLUSH: 3.0000 mL | INTRAVENOUS | Status: DC | PRN

## 2024-08-30 MED ORDER — 0.9 % SODIUM CHLORIDE (POUR BTL) OPTIME
TOPICAL | Status: DC | PRN
  Administered 2024-08-30: 1000 mL

## 2024-08-30 MED ORDER — LIDOCAINE-EPINEPHRINE 1 %-1:100000 IJ SOLN
INTRAMUSCULAR | Status: DC | PRN
  Administered 2024-08-30: 5 mL

## 2024-08-30 MED ORDER — ONDANSETRON HCL 4 MG/2ML IJ SOLN
INTRAMUSCULAR | Status: AC
Start: 2024-08-30 — End: 2024-08-30
  Filled 2024-08-30: qty 2

## 2024-08-30 MED ORDER — CEFAZOLIN SODIUM-DEXTROSE 2-4 GM/100ML-% IV SOLN
INTRAVENOUS | Status: AC
  Filled 2024-08-30: qty 100

## 2024-08-30 MED ORDER — DEXMEDETOMIDINE HCL IN NACL 80 MCG/20ML IV SOLN
INTRAVENOUS | Status: DC | PRN
  Administered 2024-08-30: 8 ug via INTRAVENOUS

## 2024-08-30 MED ORDER — EPHEDRINE 5 MG/ML INJ
INTRAVENOUS | Status: AC
Start: 2024-08-30 — End: 2024-08-30
  Filled 2024-08-30: qty 5

## 2024-08-30 MED ORDER — CEFAZOLIN SODIUM-DEXTROSE 2-4 GM/100ML-% IV SOLN
2.0000 g | INTRAVENOUS | Status: AC
  Administered 2024-08-30: 2 g via INTRAVENOUS

## 2024-08-30 MED ORDER — ACETAMINOPHEN 325 MG PO TABS: 650.0000 mg | ORAL_TABLET | ORAL | Status: DC | PRN

## 2024-08-30 MED ORDER — DEXAMETHASONE SODIUM PHOSPHATE 10 MG/ML IJ SOLN
INTRAMUSCULAR | Status: AC
  Filled 2024-08-30: qty 1

## 2024-08-30 MED ORDER — LACTATED RINGERS IV SOLN: INTRAVENOUS | Status: DC

## 2024-08-30 MED ORDER — EPHEDRINE SULFATE (PRESSORS) 50 MG/ML IJ SOLN
INTRAMUSCULAR | Status: DC | PRN
  Administered 2024-08-30: 10 mg via INTRAVENOUS

## 2024-08-30 MED ORDER — ACETAMINOPHEN 325 MG RE SUPP: 650.0000 mg | RECTAL | Status: DC | PRN

## 2024-08-30 MED ORDER — DEXAMETHASONE SODIUM PHOSPHATE 4 MG/ML IJ SOLN
INTRAMUSCULAR | Status: DC | PRN
  Administered 2024-08-30: 5 mg via INTRAVENOUS

## 2024-08-30 SURGICAL SUPPLY — 58 items
BAND RUBBER #18 3X1/16 STRL (MISCELLANEOUS) IMPLANT
BENZOIN TINCTURE PRP APPL 2/3 (GAUZE/BANDAGES/DRESSINGS) IMPLANT
BLADE CLIPPER SURG (BLADE) IMPLANT
BLADE SURG 15 STRL LF DISP TIS (BLADE) ×2 IMPLANT
BNDG ELASTIC 2INX 5YD STR LF (GAUZE/BANDAGES/DRESSINGS) IMPLANT
CANISTER SUCT 1200ML W/VALVE (MISCELLANEOUS) IMPLANT
CLEANER CAUTERY TIP PAD (MISCELLANEOUS) IMPLANT
CLSR STERI-STRIP ANTIMIC 1/2X4 (GAUZE/BANDAGES/DRESSINGS) IMPLANT
CORD BIPOLAR FORCEPS 12FT (ELECTRODE) IMPLANT
COVER BACK TABLE 60X90IN (DRAPES) ×2 IMPLANT
COVER MAYO STAND STRL (DRAPES) ×2 IMPLANT
DERMABOND ADVANCED .7 DNX12 (GAUZE/BANDAGES/DRESSINGS) IMPLANT
DRAPE LAPAROTOMY 100X72 PEDS (DRAPES) IMPLANT
DRAPE U-SHAPE 76X120 STRL (DRAPES) ×2 IMPLANT
DRESSING MEPILEX FLEX 4X4 (GAUZE/BANDAGES/DRESSINGS) IMPLANT
DRSG TEGADERM 2-3/8X2-3/4 SM (GAUZE/BANDAGES/DRESSINGS) IMPLANT
DRSG TEGADERM 4X4.75 (GAUZE/BANDAGES/DRESSINGS) IMPLANT
ELECT COATED BLADE 2.86 ST (ELECTRODE) IMPLANT
ELECT NDL BLADE 2-5/6 (NEEDLE) IMPLANT
ELECT NEEDLE BLADE 2-5/6 (NEEDLE) IMPLANT
ELECTRODE REM PT RETRN 9FT PED (ELECTROSURGICAL) IMPLANT
ELECTRODE REM PT RTRN 9FT ADLT (ELECTROSURGICAL) IMPLANT
GAUZE SPONGE 2X2 STRL 8-PLY (GAUZE/BANDAGES/DRESSINGS) IMPLANT
GAUZE SPONGE 4X4 12PLY STRL LF (GAUZE/BANDAGES/DRESSINGS) IMPLANT
GAUZE STRETCH 2X75IN STRL (MISCELLANEOUS) IMPLANT
GLOVE BIO SURGEON STRL SZ 6.5 (GLOVE) ×4 IMPLANT
GLOVE BIOGEL M STRL SZ7.5 (GLOVE) ×2 IMPLANT
GOWN STRL REUS W/ TWL LRG LVL3 (GOWN DISPOSABLE) ×4 IMPLANT
GOWN STRL REUS W/ TWL XL LVL3 (GOWN DISPOSABLE) ×2 IMPLANT
NDL HYPO 30GX1 BEV (NEEDLE) IMPLANT
NDL PRECISIONGLIDE 27X1.5 (NEEDLE) ×2 IMPLANT
NEEDLE HYPO 30GX1 BEV (NEEDLE) IMPLANT
NEEDLE PRECISIONGLIDE 27X1.5 (NEEDLE) ×1 IMPLANT
NS IRRIG 1000ML POUR BTL (IV SOLUTION) IMPLANT
PACK BASIN DAY SURGERY FS (CUSTOM PROCEDURE TRAY) ×2 IMPLANT
PENCIL SMOKE EVACUATOR (MISCELLANEOUS) IMPLANT
SHEET MEDIUM DRAPE 40X70 STRL (DRAPES) IMPLANT
SLEEVE SCD COMPRESS KNEE MED (STOCKING) IMPLANT
SPIKE FLUID TRANSFER (MISCELLANEOUS) IMPLANT
STRIP CLOSURE SKIN 1/2X4 (GAUZE/BANDAGES/DRESSINGS) IMPLANT
SUCTION TUBE FRAZIER 10FR DISP (SUCTIONS) IMPLANT
SUT MNCRL 6-0 UNDY P1 1X18 (SUTURE) IMPLANT
SUT MNCRL AB 3-0 PS2 18 (SUTURE) IMPLANT
SUT MNCRL AB 4-0 PS2 18 (SUTURE) IMPLANT
SUT MON AB 5-0 P3 18 (SUTURE) IMPLANT
SUT MON AB 5-0 PS2 18 (SUTURE) IMPLANT
SUT PDS II 3-0 CT2 27 ABS (SUTURE) IMPLANT
SUT PROLENE 5 0 P 3 (SUTURE) IMPLANT
SUT PROLENE 5 0 PS 2 (SUTURE) IMPLANT
SUT PROLENE 6 0 P 1 18 (SUTURE) IMPLANT
SUT VIC AB 4-0 PS2 18 (SUTURE) IMPLANT
SUT VIC AB 5-0 P-3 18X BRD (SUTURE) IMPLANT
SUT VIC AB 5-0 PS2 18 (SUTURE) IMPLANT
SYR BULB EAR ULCER 3OZ GRN STR (SYRINGE) IMPLANT
SYR CONTROL 10ML LL (SYRINGE) ×2 IMPLANT
TOWEL GREEN STERILE FF (TOWEL DISPOSABLE) ×2 IMPLANT
TRAY DSU PREP LF (CUSTOM PROCEDURE TRAY) ×2 IMPLANT
TUBE CONNECTING 20X1/4 (TUBING) IMPLANT

## 2024-08-30 NOTE — Transfer of Care (Signed)
 Immediate Anesthesia Transfer of Care Note  Patient: Tiffany Wood  Procedure(s) Performed: EXCISION MASS, BACK (Back)  Patient Location: PACU  Anesthesia Type:General  Level of Consciousness: awake, alert , oriented, drowsy, and patient cooperative  Airway & Oxygen Therapy: Patient Spontanous Breathing and Patient connected to face mask oxygen  Post-op Assessment: Report given to RN and Post -op Vital signs reviewed and stable  Post vital signs: Reviewed and stable  Last Vitals:  Vitals Value Taken Time  BP    Temp    Pulse    Resp    SpO2      Last Pain:  Vitals:   08/30/24 0910  TempSrc: Temporal  PainSc: 0-No pain      Patients Stated Pain Goal: 3 (08/30/24 0910)  Complications: No notable events documented.

## 2024-08-30 NOTE — Discharge Instructions (Addendum)
 Post Anesthesia Home Care Instructions  Activity: Get plenty of rest for the remainder of the day. A responsible individual must stay with you for 24 hours following the procedure.  For the next 24 hours, DO NOT: -Drive a car -Advertising copywriter -Drink alcoholic beverages -Take any medication unless instructed by your physician -Make any legal decisions or sign important papers.  Meals: Start with liquid foods such as gelatin or soup. Progress to regular foods as tolerated. Avoid greasy, spicy, heavy foods. If nausea and/or vomiting occur, drink only clear liquids until the nausea and/or vomiting subsides. Call your physician if vomiting continues.  Special Instructions/Symptoms: Your throat may feel dry or sore from the anesthesia or the breathing tube placed in your throat during surgery. If this causes discomfort, gargle with warm salt water. The discomfort should disappear within 24 hours.  If you had a scopolamine patch placed behind your ear for the management of post- operative nausea and/or vomiting:  1. The medication in the patch is effective for 72 hours, after which it should be removed.  Wrap patch in a tissue and discard in the trash. Wash hands thoroughly with soap and water. 2. You may remove the patch earlier than 72 hours if you experience unpleasant side effects which may include dry mouth, dizziness or visual disturbances. 3. Avoid touching the patch. Wash your hands with soap and water after contact with the patch.     No tylenol  until after 315pm  INSTRUCTIONS FOR AFTER SURGERY   You will likely have some questions about what to expect following your operation.  The following information will help you and your family understand what to expect when you are discharged from the hospital.  It is important to follow these guidelines to help ensure a smooth recovery and reduce complication.  Postoperative instructions include information on: diet, wound care, medications  and physical activity.  AFTER SURGERY Expect to go home after the procedure.  In some cases, you may need to spend one night in the hospital for observation.  DIET The healthier you eat the better your body will heal. It is important to increasing your protein intake.  This means limiting the foods with sugar and carbohydrates.  Focus on vegetables and some meat.  If you have liposuction during your procedure be sure to drink water.  If your urine is bright yellow, then it is concentrated, and you need to drink more water.  As a general rule after surgery, you should have 8 ounces of water every hour while awake.  If you find you are persistently nauseated or unable to take in liquids let us  know.  NO TOBACCO USE or EXPOSURE.  This will slow your healing process and lead to a wound.  WOUND CARE After 24 hours, you may remove your dressings and shower. Allow soap and water to run over the incision. Do not scrub the incision. Do not submerge the incision in bath, pool, tub, etc.  No baths, pools or hot tubs for four weeks. We close your incision to leave the smallest and best-looking scar. No ointment or creams on your incisions for four weeks.  No Neosporin (Too many skin reactions).  A few weeks after surgery you can use Mederma and start massaging the scar.  ACTIVITY No heavy lifting until cleared by the doctor.  This usually means no more than a half-gallon of milk.  It is OK to walk and climb stairs. Moving your legs is very important to decrease your risk  of a blood clot.  It will also help keep you from getting deconditioned.  Every 1 to 2 hours get up and walk for 5 minutes. This will help with a quicker recovery back to normal.  Let pain be your guide so you don't do too much.  This time is for you to recover.  You will be more comfortable if you sleep and rest with your head elevated either with a few pillows under you or in a recliner.  No stomach sleeping for a three  months.   DRIVING Arrange for someone to bring you home from the hospital after your surgery.  You may be able to drive a few days after surgery but not while taking any narcotics or valium.  BOWEL MOVEMENTS Constipation can occur after anesthesia and while taking pain medication.  It is important to stay ahead for your comfort.  We recommend taking Milk of Magnesia (2 tablespoons; twice a day) while taking the pain pills.  MEDICATIONS You may be prescribed should start after surgery At your preoperative visit for you history and physical you may have been given the following medications: Zofran  4 mg:  This is to treat nausea and vomiting.  You can take this every 6 hours as needed and only if needed. Tramadol  50 mg:  This is only to be used after you have taken the Motrin or the Tylenol . Every 8 hours as needed.   Over the counter Medication to take: Ibuprofen (Motrin) 600 mg:  Take this every 6 hours.  If you have additional pain then take 500 mg of the Tylenol  every 8 hours.  Only take the Norco after you have tried these two. MiraLAX or Milk of Magnesia: Take this according to the bottle if you take the Norco.  WHEN TO CALL Call your surgeon's office if any of the following occur: Fever 101 degrees F or greater Excessive bleeding or fluid from the incision site. Pain that increases over time without aid from the medications Redness, warmth, or pus draining from incision sites Persistent nausea or inability to take in liquids Severe misshapen area that underwent the operation.

## 2024-08-30 NOTE — Op Note (Signed)
 DATE OF OPERATION: 08/30/2024  LOCATION: [facility]  PREOPERATIVE DIAGNOSIS: Back lipoma x 2  POSTOPERATIVE DIAGNOSIS: Same  PROCEDURE:  Excision of posterior neck mass/lipoma 5 x 5 cm Excision of back mass midline lipoma 4 x 6 cm  SURGEON: Laverne Hursey, DO  EBL: None  CONDITION: Stable  COMPLICATIONS: None  INDICATION: The patient, Tiffany Wood, is a 53 y.o. female born on [birth year], is here for treatment of 2 back masses.  The appear to be like lipomas and the decision was made to bring her to the OR for excision since they were getting larger in size.SABRA   PROCEDURE DETAILS:  The patient was seen prior to surgery and marked.  The IV antibiotics were given. The patient was taken to the operating room and given a general anesthetic. A standard time out was performed and all information was confirmed by those in the room. SCDs were placed.   The patient was placed in the prone position.  Her back was prepped and draped.  Local with epinephrine  was injected under the skin of each area for intraoperative hemostasis.  Posterior neck: The 15 blade was used to make a vertical incision on the neck.  The Bovie was used to dissect down to the mass.  It was a lipoma and was completely removed.  It was 5 x 5 cm in size.  It was in the subcuticular adipose plane.  Hemostasis was achieved with electrocautery.  The deep layer was closed with 3-0 PDS and 3-0 Monocryl was used to close the skin.  Back: The 15 blade was used to make a vertical incision over the lesion.  The Bovie was used to dissect down to the lesion.  The lesion was in the subcuticular adipose plane.  It was released from the surrounding tissue and completely removed.  It was 4 x 6 cm in size.  Hemostasis was achieved with electrocautery.  The deep layer was closed with 3-0 Monocryl.  The deep layer of the skin was closed with 3-0 PDS and then the skin was closed with 3-0 Monocryl.  Both areas were covered with  Dermabond and Steri-Strips and a sterile dressing. The patient was allowed to wake up and taken to recovery room in stable condition at the end of the case. The family was notified at the end of the case.

## 2024-08-30 NOTE — Interval H&P Note (Signed)
 History and Physical Interval Note:  08/30/2024 9:44 AM  Tiffany Wood  has presented today for surgery, with the diagnosis of Mass on back.  The various methods of treatment have been discussed with the patient and family. After consideration of risks, benefits and other options for treatment, the patient has consented to  Procedure(s) with comments: EXCISION MASS, BACK (N/A) - Excision of back mass x 2 as a surgical intervention.  The patient's history has been reviewed, patient examined, no change in status, stable for surgery.  I have reviewed the patient's chart and labs.  Questions were answered to the patient's satisfaction.     Estefana RAMAN Jasai Sorg

## 2024-08-30 NOTE — Anesthesia Preprocedure Evaluation (Addendum)
 Anesthesia Evaluation  Patient identified by MRN, date of birth, ID band Patient awake    Reviewed: Allergy & Precautions, NPO status , Patient's Chart, lab work & pertinent test results  History of Anesthesia Complications Negative for: history of anesthetic complications  Airway Mallampati: I  TM Distance: >3 FB Neck ROM: Full    Dental no notable dental hx.    Pulmonary neg pulmonary ROS   Pulmonary exam normal        Cardiovascular negative cardio ROS Normal cardiovascular exam     Neuro/Psych negative neurological ROS     GI/Hepatic negative GI ROS, Neg liver ROS,,,  Endo/Other  BMI 32  Renal/GU negative Renal ROS     Musculoskeletal negative musculoskeletal ROS (+)    Abdominal   Peds  Hematology negative hematology ROS (+)   Anesthesia Other Findings Mass on back  Reproductive/Obstetrics                              Anesthesia Physical Anesthesia Plan  ASA: 2  Anesthesia Plan: General   Post-op Pain Management: Tylenol  PO (pre-op)*   Induction: Intravenous  PONV Risk Score and Plan: 3 and Treatment may vary due to age or medical condition, Midazolam , Ondansetron  and Dexamethasone   Airway Management Planned: Oral ETT  Additional Equipment: None  Intra-op Plan:   Post-operative Plan: Extubation in OR  Informed Consent: I have reviewed the patients History and Physical, chart, labs and discussed the procedure including the risks, benefits and alternatives for the proposed anesthesia with the patient or authorized representative who has indicated his/her understanding and acceptance.     Dental advisory given  Plan Discussed with: CRNA  Anesthesia Plan Comments:          Anesthesia Quick Evaluation

## 2024-08-31 ENCOUNTER — Encounter (HOSPITAL_BASED_OUTPATIENT_CLINIC_OR_DEPARTMENT_OTHER): Payer: Self-pay | Admitting: Plastic Surgery

## 2024-08-31 ENCOUNTER — Encounter: Payer: Self-pay | Admitting: Plastic Surgery

## 2024-08-31 NOTE — Anesthesia Postprocedure Evaluation (Signed)
 Anesthesia Post Note  Patient: Tiffany Wood  Procedure(s) Performed: EXCISION MASS, BACK (Back)     Patient location during evaluation: PACU Anesthesia Type: General Level of consciousness: awake and alert Pain management: pain level controlled Vital Signs Assessment: post-procedure vital signs reviewed and stable Respiratory status: spontaneous breathing, nonlabored ventilation and respiratory function stable Cardiovascular status: blood pressure returned to baseline Postop Assessment: no apparent nausea or vomiting Anesthetic complications: no   No notable events documented.  Last Vitals:  Vitals:   08/30/24 1200 08/30/24 1238  BP: (!) 146/98 (!) 150/99  Pulse: 74 74  Resp: 17 18  Temp:  (!) 36.4 C  SpO2: 97% 98%    Last Pain:  Vitals:   08/30/24 1238  TempSrc: Temporal  PainSc:    Pain Goal: Patients Stated Pain Goal: 3 (08/30/24 0910)                 Vertell Row

## 2024-09-03 LAB — SURGICAL PATHOLOGY

## 2024-09-04 ENCOUNTER — Telehealth: Payer: Self-pay

## 2024-09-04 NOTE — Telephone Encounter (Signed)
 I called the patient and confirmed DOB. We discussed her pathology results which came back as Lipomas. Patient conveyed understanding.

## 2024-09-07 ENCOUNTER — Encounter: Payer: Self-pay | Admitting: Plastic Surgery

## 2024-09-07 ENCOUNTER — Ambulatory Visit: Admitting: Plastic Surgery

## 2024-09-07 DIAGNOSIS — R222 Localized swelling, mass and lump, trunk: Secondary | ICD-10-CM

## 2024-09-07 NOTE — Progress Notes (Signed)
 The patient is a 53 year old female here for follow-up after having lipomas removed from her back.  Overall she is doing really well.  There is no sign of infection.  No sign of hematoma or seroma.  She has some bruising as expected.  Keep the Steri-Strips in place till next visit we will take him off and remove the stitch.  Fresh dressing was applied.

## 2024-09-21 ENCOUNTER — Encounter: Payer: Self-pay | Admitting: Surgical

## 2024-09-21 ENCOUNTER — Ambulatory Visit: Admitting: Surgical

## 2024-09-21 VITALS — BP 139/90 | HR 74 | Ht 64.0 in | Wt 188.0 lb

## 2024-09-21 DIAGNOSIS — R222 Localized swelling, mass and lump, trunk: Secondary | ICD-10-CM

## 2024-09-21 DIAGNOSIS — Z9889 Other specified postprocedural states: Secondary | ICD-10-CM

## 2024-09-21 NOTE — Progress Notes (Signed)
 53 yo female here for follow up after excision of upper and mid back masses, pathology report showed lipomas. Patient is doing well.  She reports she is going camping this weekend and has questions about restrictions.  On exam back incisions are intact and healing well.  Monocryl suture knots are noted.  There is no erythema or cellulitic changes noted.  She does have some irritation around the suture knots.  No subcutaneous fluid collection with palpation.  No tenderness noted.  A/P:  Discussed the importance of continuing to avoid heavy lifting, strenuous activities to decrease risk of dehiscence.  Monocryl suture knots were removed and patient tolerated this well.  There is no signs of infection or concern on exam.  Discussed swelling will continue to improve over the next few weeks.  Recommend following up as needed or in 2 to 3 weeks depending on patient preference.  Pictures were obtained of the patient and placed in the chart with the patient's or guardian's permission.

## 2024-10-02 ENCOUNTER — Ambulatory Visit: Admitting: Surgical

## 2024-10-02 DIAGNOSIS — R222 Localized swelling, mass and lump, trunk: Secondary | ICD-10-CM

## 2024-10-02 NOTE — Progress Notes (Signed)
 Patient is a 53 year old female here for follow-up after excision of upper and mid back lipomas on 08/30/2024.  She was last seen in the office on 09/21/2024.  She was overall doing well at that point.  She did go on a camping trip 2 weekends ago.  Since then she has noticed some increased tenderness on the left upper back and also noticed a small stitch come out of her mid back incision.  She is otherwise doing well.  She is not having any infectious symptoms.  She is here today for evaluation of the areas.  On exam mid back incision is intact and healing well.  There is no erythema or cellulitic changes noted.  No subcutaneous fluid collection noted palpation.  On exam of the upper left back incision, there is some subcutaneous fluid collection and swelling noted with palpation.  There is no overlying skin changes.  Incision is intact and well-healed.  There is some mild tenderness.  A/P:  Recommend continuing to monitor the area.  We did discuss options for aspiration of the area due to possible seroma/fluid collection.  Given the location and proximity to midline, would like to continue to monitor the area.  We did discuss that in office aspiration or aspiration under ultrasound may be needed if the area does not improve on its own.  We discussed continuing to monitor the area for increased swelling, redness, increased tenderness.  At this time, do not appreciate any signs of infection or concern.  Discussed importance of continuing to avoid strenuous activities or heavy lifting.  Recommend following up in about 2 to 3 weeks for reevaluation

## 2024-10-23 ENCOUNTER — Ambulatory Visit (INDEPENDENT_AMBULATORY_CARE_PROVIDER_SITE_OTHER): Admitting: Plastic Surgery

## 2024-10-23 ENCOUNTER — Encounter: Payer: Self-pay | Admitting: Plastic Surgery

## 2024-10-23 DIAGNOSIS — D171 Benign lipomatous neoplasm of skin and subcutaneous tissue of trunk: Secondary | ICD-10-CM | POA: Insufficient documentation

## 2024-10-23 NOTE — Progress Notes (Signed)
 The patient is a 53 year old female here for follow-up after having 2 masses removed from her back.  They were lipomas.  She is doing really well.  No sign of infection.  No seromas.  She is starting to get some feeling back.  She can certainly use some scar cream to help decrease the discoloration.  Follow-up as needed.  Pictures were obtained of the patient and placed in the chart with the patient's or guardian's permission.
